# Patient Record
Sex: Female | Born: 2000 | Race: White | Hispanic: No | Marital: Single | Smoking: Former smoker
Health system: Southern US, Community
[De-identification: ages and names within clinical notes are randomized; demographics above are authoritative.]

## PROBLEM LIST (undated history)

## (undated) DIAGNOSIS — J309 Allergic rhinitis, unspecified: Secondary | ICD-10-CM

## (undated) DIAGNOSIS — F419 Anxiety disorder, unspecified: Secondary | ICD-10-CM

## (undated) DIAGNOSIS — J069 Acute upper respiratory infection, unspecified: Secondary | ICD-10-CM

## (undated) DIAGNOSIS — J45909 Unspecified asthma, uncomplicated: Secondary | ICD-10-CM

## (undated) DIAGNOSIS — F329 Major depressive disorder, single episode, unspecified: Secondary | ICD-10-CM

## (undated) DIAGNOSIS — G43909 Migraine, unspecified, not intractable, without status migrainosus: Secondary | ICD-10-CM

## (undated) HISTORY — DX: Unspecified asthma, uncomplicated: J45.909

## (undated) HISTORY — DX: Acute upper respiratory infection, unspecified: J06.9

## (undated) HISTORY — DX: Allergic rhinitis, unspecified: J30.9

## (undated) HISTORY — DX: Anxiety disorder, unspecified: F41.9

## (undated) HISTORY — DX: Major depressive disorder, single episode, unspecified: F32.9

## (undated) HISTORY — DX: Migraine, unspecified, not intractable, without status migrainosus: G43.909

---
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
## 2018-02-15 ENCOUNTER — Ambulatory Visit: Payer: BLUE CROSS/BLUE SHIELD | Admitting: Allergy

## 2018-02-15 ENCOUNTER — Encounter: Payer: Self-pay | Admitting: Allergy

## 2018-02-15 VITALS — BP 90/56 | HR 97 | Resp 16 | Ht 64.0 in | Wt 128.6 lb

## 2018-02-15 DIAGNOSIS — J310 Chronic rhinitis: Secondary | ICD-10-CM

## 2018-02-15 DIAGNOSIS — J4540 Moderate persistent asthma, uncomplicated: Secondary | ICD-10-CM | POA: Diagnosis not present

## 2018-02-15 MED ORDER — AZELASTINE-FLUTICASONE 137-50 MCG/ACT NA SUSP: 1.0000 | Freq: Two times a day (BID) | NASAL | 5 refills | Status: DC | PRN

## 2018-02-15 NOTE — Progress Notes (Signed)
New Patient Note  RE: Valerie [name]. [place] MRN: [id number] DOB: [birth year] Date of Office Visit: 02/15/2018  Referring provider: Corwin Levins, MD Primary care provider: Corwin Levins, MD  Chief Complaint: Breathing problems and sinus issues  History of present illness: Valerie [name] is a 17 y.o. female presenting today for consultation for asthma and allergies.  She has been having "breathing problems".  She states sometimes her airway feels "tight" or "too closed" and sometimes she notes "too much mucus" in her throat.  She states symptoms are worse at night and at work (works as Merchandiser, retail at [employer].)   She also reports cough and wheeze as well.  She has history of childhood asthma.  She states her asthma symptoms has been getting worse throughout the course of the year.    She has been on 3 rounds of antibiotics and steroids this year for "respiratory infections" and "asthma symptoms".  Last steroid course was about 1-2 months. No hospitalizations. Nighttime awakening 3 nights/week.   She does have a hydrocodone based cough syrup that she states she used sparingly and mostly will use at night if the cough is keeping her up and if the albuterol is not responding. She has been on symbicort now for past 1-2 months taking 1 puff twice a day.  She also was started on singulair about a 1 month ago.  She does feel that with the addition of Symbicort that her breathing issues are better but not resolved.  She also reports sinus issues.  She reports sinus infections 1-2 times a year treated with antibiotics.  She also reports having sinus HA, nasal congestion and drainage, eye pressure, sneezing.  Symptoms were primarily in spring and fall but this year states symptoms have been year-round.   She states she has tried Excedrin PM for the eye pressure and HA symptoms. She has used nasal spray but not sure what it was.    No history of eczema or food allergy.   Review of systems: Review  of Systems  Constitutional: Negative for chills, fever and malaise/fatigue.  HENT: Positive for congestion and sinus pain. Negative for ear discharge, ear pain, nosebleeds and sore throat.   Eyes: Negative for pain, discharge and redness.  Respiratory: Positive for cough, shortness of breath and wheezing.   Cardiovascular: Negative for chest pain.  Gastrointestinal: Negative for abdominal pain, constipation, diarrhea, nausea and vomiting.  Musculoskeletal: Negative for joint pain.  Skin: Negative for itching and rash.  Neurological: Positive for headaches.    All other systems negative unless noted above in HPI  Past medical history: Past Medical History:  Diagnosis Date  . Allergic rhinitis   . Anxiety   . Childhood asthma   . Depression 05/19/2017  . Migraine     Past surgical history: No past surgical history on file.  Family history:  Family History  Problem Relation Age of Onset  . Arthritis Mother   . Asthma Mother   . Depression Mother   . Hyperlipidemia Mother   . Depression Father   . Arthritis Maternal Grandmother   . Bone cancer Maternal Grandmother   . Heart disease Maternal Grandmother   . Stroke Maternal Grandmother   . Alcohol abuse Maternal Grandfather   . COPD Maternal Grandfather   . Cancer Maternal Grandfather   . AAA (abdominal aortic aneurysm) Maternal Grandfather   . Arthritis Paternal Grandmother   . Depression Paternal Grandmother   . Hypertension Paternal Grandmother   .  Arthritis Paternal Grandfather   . Hyperlipidemia Paternal Grandfather     Social history: She lives in a home with carpeting in the bedroom with electric heating and central cooling.  There are 6 dogs and 1 cat in the home.  There are chickens and [place] pigs outside the home.  There is no concern for roaches in the home but there is concern for water damage or mildew.  She is a Estate manager/land agent.  She denies a smoking history but states she did use a juul  e-cigarette for a couple of months.  Medication List: Allergies as of 02/15/2018   No Known Allergies     Medication List        Accurate as of 02/15/18  1:54 PM. Always use your most recent med list.          acetaminophen 500 MG tablet Commonly known as:  TYLENOL Take 500 mg by mouth every 6 (six) hours as needed.   albuterol 108 (90 Base) MCG/ACT inhaler Commonly known as:  PROVENTIL HFA;VENTOLIN HFA Inhale 2 puffs into the lungs every 6 (six) hours as needed for wheezing or shortness of breath.   aspirin-acetaminophen-caffeine 250-250-65 MG tablet Commonly known as:  EXCEDRIN MIGRAINE Take 1 tablet by mouth every 6 (six) hours as needed for headache.   budesonide-formoterol 160-4.5 MCG/ACT inhaler Commonly known as:  SYMBICORT Inhale 2 puffs into the lungs 2 (two) times daily.   citalopram 40 MG tablet Commonly known as:  CELEXA Take 1 tablet (40 mg total) by mouth daily.   ibuprofen 200 MG tablet Commonly known as:  ADVIL,MOTRIN Take 200 mg by mouth every 6 (six) hours as needed.   montelukast 10 MG tablet Commonly known as:  SINGULAIR Take 1 tablet (10 mg total) by mouth daily.       Known medication allergies: No Known Allergies   Physical examination: Blood pressure (!) 90/56, pulse 97, resp. rate 16, height 5\' 4"  (1.626 m), weight 128 lb 9.6 oz (58.3 kg), SpO2 97 %.  General: Alert, interactive, in no acute distress. HEENT: PERRLA, TMs pearly gray, turbinates moderately edematous without discharge, post-pharynx non erythematous. Neck: Supple without lymphadenopathy. Lungs: Clear to auscultation without wheezing, rhonchi or rales. {no increased work of breathing. CV: Normal S1, S2 without murmurs. Abdomen: Nondistended, nontender. Skin: Warm and dry, without lesions or rashes. Extremities:  No clubbing, cyanosis or edema. Neuro:   Grossly intact.  Diagnositics/Labs:  Spirometry: FEV1: 2.02L 60%, FVC: 3.37L 89% s/p BD she reversed to normal at  2.73L 81% FEV1 predicted  Allergy testing: environmental allergy skin prick testing is negative with positive histamine control.   Allergy testing results were read and interpreted by provider, documented by clinical staff.   Assessment and plan:   Asthma, moderate persistent   - continue Symbicort 2 puffs twice a day   - continue singulair 10mg  daily   - will obtain CBC to assess degree of eosinophilia which will help determine step-up therapy for asthma control if needed   Asthma control goals:   Full participation in all desired activities (may need albuterol before activity)  Albuterol use two time or less a week on average (not counting use with activity)  Cough interfering with sleep two time or less a month  Oral steroids no more than once a year  No hospitalizations  Rhinitis    - presumed allergic however environmental allergy testing today is negative   - will obtain environmental allergy panel by blood work   -  recommend taking daily antihistamine like Allegra 180mg , Xyzal 5mg  or Zyrtec 10mg  daily   - singulair as above   - for nasal congestion/drainage recommend using Dymista.  Trial dymista 1 spray each nostril twice a day.  This is a combination nasal spray with Flonase + Astelin (nasal antihistamine).  This helps with both nasal congestion and drainage.   Follow-up 3-4 months or sooner if needed  I appreciate the opportunity to take part in [place] care. Please do not hesitate to contact me with questions.  Sincerely,   Margo Aye, MD Allergy/Immunology [facility]

## 2018-02-15 NOTE — Patient Instructions (Addendum)
Asthma   - continue Symbicort 2 puffs twice a day   - continue singulair 10mg  daily   - will obtain CBC to assess degree of eosinophilia which will help determine step-up therapy for asthma control if needed   Asthma control goals:   Full participation in all desired activities (may need albuterol before activity)  Albuterol use two time or less a week on average (not counting use with activity)  Cough interfering with sleep two time or less a month  Oral steroids no more than once a year  No hospitalizations  Rhinitis    - presumed allergic however environmental allergy testing today is negative   - will obtain environmental allergy panel by blood work   - recommend taking daily antihistamine like Allegra 180mg , Xyzal 5mg  or Zyrtec 10mg  daily   - singulair as above   - for nasal congestion/drainage recommend using Dymista.  Trial dymista 1 spray each nostril twice a day.  This is a combination nasal spray with Flonase + Astelin (nasal antihistamine).  This helps with both nasal congestion and drainage.   Follow-up 3-4 months or sooner if needed

## 2018-02-18 LAB — ALLERGENS W/TOTAL IGE AREA 2
Aspergillus Fumigatus IgE: <0.1 kU/L
Bermuda Grass IgE: <0.1 kU/L
Common Silver Birch IgE: <0.1 kU/L
Elm, American IgE: <0.1 kU/L
IgE (Immunoglobulin E), Serum: 34 IU/mL (ref 6–495)
Johnson Grass IgE: <0.1 kU/L
Maple/Box Elder IgE: <0.1 kU/L
Oak, White IgE: <0.1 kU/L
Pigweed, Rough IgE: <0.1 kU/L
Timothy Grass IgE: <0.1 kU/L
White Mulberry IgE: <0.1 kU/L

## 2018-02-18 LAB — CBC WITH DIFFERENTIAL
Basophils Absolute: 0.1 10*3/uL (ref 0.0–0.2)
Basos: 1 %
EOS (ABSOLUTE): 0.5 10*3/uL — AB (ref 0.0–0.4)
EOS: 8 %
HEMATOCRIT: 40.4 % (ref 34.0–46.6)
Hemoglobin: 14 g/dL (ref 11.1–15.9)
IMMATURE GRANULOCYTES: 0 %
Immature Grans (Abs): 0 10*3/uL (ref 0.0–0.1)
LYMPHS ABS: 2 10*3/uL (ref 0.7–3.1)
Lymphs: 35 %
MCH: 31 pg (ref 26.6–33.0)
MCHC: 34.7 g/dL (ref 31.5–35.7)
MCV: 90 fL (ref 79–97)
MONOS ABS: 0.4 10*3/uL (ref 0.1–0.9)
Monocytes: 8 %
Neutrophils Absolute: 2.7 10*3/uL (ref 1.4–7.0)
Neutrophils: 48 %
RBC: 4.51 x10E6/uL (ref 3.77–5.28)
RDW: 12.5 % (ref 12.3–15.4)
WBC: 5.6 10*3/uL (ref 3.4–10.8)

## 2018-02-27 ENCOUNTER — Telehealth: Payer: Self-pay

## 2018-02-27 NOTE — Telephone Encounter (Signed)
Spoke to patient and she will be starting [place]

## 2018-02-27 NOTE — Telephone Encounter (Signed)
Patient called and stated that she received a my chart message about starting Harrington Challenger due to her lab results. Patient is interested in starting [place] and would like someone to reach out to discuss it more. Tammy could you please call patient and discuss this matter.

## 2018-02-27 NOTE — Telephone Encounter (Signed)
Noted - thank you very much!

## 2018-02-28 NOTE — Telephone Encounter (Signed)
Just FYI, I have started her PA process but her PBM is [employer] with Harrington Challenger in non formulary.  I am trying to get approval through her medical so will see.  May 24 2018 Harrington Challenger goes back on their formulary so I will try my best to work it out for her

## 2018-02-28 NOTE — Telephone Encounter (Signed)
Sounds good. Thanks for the update!   Braelynn Benning, MD [facility]  

## 2018-02-28 NOTE — Telephone Encounter (Signed)
Awesome! Thanks for taking such good care of the patients.  Malachi Bonds, MD [facility]

## 2018-03-06 NOTE — Telephone Encounter (Signed)
Please call patient with an update.

## 2018-03-07 ENCOUNTER — Telehealth: Payer: Self-pay | Admitting: *Deleted

## 2018-03-07 NOTE — Telephone Encounter (Signed)
Thanks Tammy for update.   Agree lets change to Nucala to get started on medication for better asthma control.

## 2018-03-07 NOTE — Telephone Encounter (Signed)
Called patient to discuss biologic for asthma again.  She has [employer] [place] and [address] prescription coverage.  Unfortunately we are unable to get coverage for Harrington Challenger thru the PBM since it is excluded and her medical has not approved same. I explained that Nucala could be as effective and option to get patient on anti IL-5 until Jan. 2020 when Harrington Challenger is added back to formulary and she is ok with that.  She is interested in possibly changing to autoinjector in future but I advised her that is discussion she could have in Jan and can reassess if she wants to stay on Nucala or change over to [place] at that time.

## 2018-03-07 NOTE — Telephone Encounter (Signed)
Called patient to discuss biologic for asthma again.  She has [employer] [place] and [address] prescription coverage.  Unfortunately we are unable to get coverage for Harrington Challenger thru the PBM since it is excluded and her medical has not approved same. I explained that Nucala could be as effective and option to get patient on anti IL-5 until Jan. 2020 when Harrington Challenger is added back to formulary and she is ok with that.  She is interested in possibly changing to autoinjector in future but I advised her that is discussion she could have in Jan and can reassess if she wants to stay on Nucala or change over to [place] at that time.

## 2018-03-08 ENCOUNTER — Ambulatory Visit (INDEPENDENT_AMBULATORY_CARE_PROVIDER_SITE_OTHER): Payer: BLUE CROSS/BLUE SHIELD | Admitting: *Deleted

## 2018-03-08 DIAGNOSIS — J4550 Severe persistent asthma, uncomplicated: Secondary | ICD-10-CM

## 2018-03-08 MED ORDER — MEPOLIZUMAB 100 MG ~~LOC~~ SOLR
100.0000 mg | SUBCUTANEOUS | Status: DC
  Administered 2018-03-08 – 2018-05-23 (×2): 100 mg via SUBCUTANEOUS

## 2018-03-08 NOTE — Progress Notes (Signed)
Immunotherapy   Patient Details  Name: Valerie [name]. [place] MRN: [id number] Date of Birth: [birth year]  03/08/2018  [name] started injections for  Nucala 100 mg every 28 days. Epi-Pen:Epi-Pen Available  Consent signed and patient instructions given. No problems after 30 minutes in the office.   Mariane Duval 03/08/2018, 2:30 PM

## 2018-03-22 ENCOUNTER — Ambulatory Visit: Payer: BLUE CROSS/BLUE SHIELD

## 2018-03-25 ENCOUNTER — Telehealth: Payer: BLUE CROSS/BLUE SHIELD | Admitting: Physician Assistant

## 2018-03-25 DIAGNOSIS — J8283 Eosinophilic asthma: Secondary | ICD-10-CM

## 2018-03-25 DIAGNOSIS — J4541 Moderate persistent asthma with (acute) exacerbation: Secondary | ICD-10-CM

## 2018-03-25 DIAGNOSIS — J82 Pulmonary eosinophilia, not elsewhere classified: Principal | ICD-10-CM

## 2018-03-25 NOTE — Progress Notes (Signed)
Based on what you shared with me it looks like you have a serious condition that should be evaluated in a face to face examination. 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 You may likely need a breathing treatment and systemic steroids. We need to rule out pneumonia given your history, as you are ar a higher risk for complications from respiratory infections.  NOTE: If you entered your credit card information for this eVisit, you will not be charged. You may see a "hold" on your card for the $30 but that hold will drop off and you will not have a charge processed.  If you are having a true medical emergency please call 911.  If you need an urgent face to face visit, [facility] has four urgent care centers for your convenience.  If you need care fast and have a high deductible or no insurance consider:   [email or web address] to reserve your spot online an avoid wait times  [facility] [address], [address] [place], [place] [address] 8 am to 8 pm Monday-Friday 10 am to 4 pm Saturday-Sunday *Across the street from [employer]  [address] [place], [address] 8 am to 5 pm Monday-Friday * In the [facility] on the [facility]   The following sites will take your  insurance:  . [facility]  [phone] Get Driving Directions Find a Provider at this Location  [address] [place], [place] [address] . 10 am to 8 pm Monday-Friday . 12 pm to 8 pm Saturday-Sunday   . [facility] at [facility]  [phone] Get Driving Directions Find a Provider at this Location  [address], [address] [place], [place] [address] . 8 am to 8 pm Monday-Friday . 9 am to 6 pm Saturday . 11 am to 6 pm Sunday   . [facility] at [facility]  [phone] Get Driving Directions  [address] [place], [place] [address] . 8 am to 8 pm Monday-Friday . 8 am to 4 pm Saturday-Sunday   Your e-visit answers were reviewed by a board certified advanced clinical practitioner to complete your personal care plan.  Thank you for using e-Visits.

## 2018-03-28 ENCOUNTER — Encounter: Payer: Self-pay | Admitting: Family

## 2018-03-28 ENCOUNTER — Ambulatory Visit: Payer: BLUE CROSS/BLUE SHIELD | Admitting: Family

## 2018-03-28 VITALS — BP 100/60 | HR 72 | Temp 98.1°F | Ht 64.0 in | Wt 131.1 lb

## 2018-03-28 DIAGNOSIS — J45909 Unspecified asthma, uncomplicated: Secondary | ICD-10-CM | POA: Diagnosis not present

## 2018-03-28 MED ORDER — DOXYCYCLINE HYCLATE 100 MG PO TABS: 100.0000 mg | ORAL_TABLET | Freq: Two times a day (BID) | ORAL | 0 refills | Status: DC

## 2018-03-28 MED ORDER — BENZONATATE 100 MG PO CAPS: 100.0000 mg | ORAL_CAPSULE | Freq: Three times a day (TID) | ORAL | 0 refills | Status: DC | PRN

## 2018-03-28 MED ORDER — HYDROCODONE-HOMATROPINE 5-1.5 MG/5ML PO SYRP: 5.0000 mL | ORAL_SOLUTION | Freq: Four times a day (QID) | ORAL | 0 refills | Status: AC | PRN

## 2018-03-28 NOTE — Progress Notes (Signed)
Valerie [name] is a 17 y.o. female with the following history as recorded in EpicCare:  Patient Active Problem List   Diagnosis Date Noted  . Asthma exacerbation 12/27/2017  . Ear pain, bilateral 12/01/2017  . Cough 11/16/2017  . Urinary tract infection without hematuria 05/22/2017  . Depression 05/19/2017  . Encounter for well adult exam with abnormal findings 05/19/2017  . Mild intermittent asthma   . Allergic rhinitis   . Migraine   . Anxiety     Current Outpatient Medications  Medication Sig Dispense Refill  . acetaminophen (TYLENOL) 500 MG tablet Take 500 mg by mouth every 6 (six) hours as needed.    Marland Kitchen albuterol (PROVENTIL HFA;VENTOLIN HFA) 108 (90 Base) MCG/ACT inhaler Inhale 2 puffs into the lungs every 6 (six) hours as needed for wheezing or shortness of breath. 18 g 11  . aspirin-acetaminophen-caffeine (EXCEDRIN MIGRAINE) 250-250-65 MG tablet Take 1 tablet by mouth every 6 (six) hours as needed for headache.    . Azelastine-Fluticasone 137-50 MCG/ACT SUSP Place 1 spray into the nose 2 (two) times daily as needed. 23 g 5  . budesonide-formoterol (SYMBICORT) 160-4.5 MCG/ACT inhaler Inhale 2 puffs into the lungs 2 (two) times daily. 1 Inhaler 11  . citalopram (CELEXA) 40 MG tablet Take 1 tablet (40 mg total) by mouth daily. 90 tablet 3  . ibuprofen (ADVIL,MOTRIN) 200 MG tablet Take 200 mg by mouth every 6 (six) hours as needed.    . montelukast (SINGULAIR) 10 MG tablet Take 1 tablet (10 mg total) by mouth daily. 30 tablet 11  . NUCALA 100 MG SOLR     . benzonatate (TESSALON) 100 MG capsule Take 1 capsule (100 mg total) by mouth 3 (three) times daily as needed. 20 capsule 0  . doxycycline (VIBRA-TABS) 100 MG tablet Take 1 tablet (100 mg total) by mouth 2 (two) times daily. 20 tablet 0  . HYDROcodone-homatropine (HYCODAN) 5-1.5 MG/5ML syrup Take 5 mLs by mouth every 6 (six) hours as needed for up to 10 days for cough. 100 mL 0   Current Facility-Administered Medications   Medication Dose Route Frequency Provider Last Rate Last Dose  . Mepolizumab SOLR 100 mg  100 mg Subcutaneous Q28 days Marcelyn Bruins, MD   100 mg at 03/08/18 1431    Allergies: Patient has no known allergies.  Past Medical History:  Diagnosis Date  . Allergic rhinitis   . Anxiety   . Childhood asthma   . Depression 05/19/2017  . Migraine     History reviewed. No pertinent surgical history.  Family History  Problem Relation Age of Onset  . Arthritis Mother   . Asthma Mother   . Depression Mother   . Hyperlipidemia Mother   . Depression Father   . Arthritis Maternal Grandmother   . Bone cancer Maternal Grandmother   . Heart disease Maternal Grandmother   . Stroke Maternal Grandmother   . Alcohol abuse Maternal Grandfather   . COPD Maternal Grandfather   . Cancer Maternal Grandfather   . AAA (abdominal aortic aneurysm) Maternal Grandfather   . Arthritis Paternal Grandmother   . Depression Paternal Grandmother   . Hypertension Paternal Grandmother   . Arthritis Paternal Grandfather   . Hyperlipidemia Paternal Grandfather     Social History   Tobacco Use  . Smoking status: Never Smoker  . Smokeless tobacco: Never Used  Substance Use Topics  . Alcohol use: No    Subjective:  Patient presents with 1 week history of cough/ congestion; eosinophilic  esophagitis/ asthma; has needed increase use of her inhaler recently;   LMP- 4 days ago;      Objective:  Vitals:   03/28/18 1011  BP: 100/60  Pulse: 72  Temp: 98.1 F (36.7 C)  TempSrc: Oral  SpO2: 99%  Weight: 131 lb 1.3 oz (59.5 kg)  Height: 5\' 4"  (1.626 m)    General: Well developed, well nourished, in no acute distress  Skin : Warm and dry.  Head: Normocephalic and atraumatic  Eyes: Sclera and conjunctiva clear; pupils round and reactive to light; extraocular movements intact  Ears: External normal; canals clear; tympanic membranes normal  Oropharynx: Pink, supple. No suspicious lesions  Neck:  Supple without thyromegaly, adenopathy  Lungs: Respirations unlabored; clear to auscultation bilaterally without wheeze, rales, rhonchi  CVS exam: normal rate and regular rhythm.  Neurologic: Alert and oriented; speech intact; face symmetrical; moves all extremities well; CNII-XII intact without focal deficit   Assessment:  1. Acute asthmatic bronchitis     Plan:  Rx for Doxycycline 100 mg bid x 10 days, Tessalon Perles 100 mg tid prn and Hycodan cough syrup to use at night as needed; increase fluids, rest; do not feel steroids warranted at this time. She is encouraged to contact her asthma specialist about symptoms- adjustment may need to be made to Nucala schedule.   No follow-ups on file.  No orders of the defined types were placed in this encounter.   Requested Prescriptions   Signed Prescriptions Disp Refills  . doxycycline (VIBRA-TABS) 100 MG tablet 20 tablet 0    Sig: Take 1 tablet (100 mg total) by mouth 2 (two) times daily.  . benzonatate (TESSALON) 100 MG capsule 20 capsule 0    Sig: Take 1 capsule (100 mg total) by mouth 3 (three) times daily as needed.  Marland Kitchen HYDROcodone-homatropine (HYCODAN) 5-1.5 MG/5ML syrup 100 mL 0    Sig: Take 5 mLs by mouth every 6 (six) hours as needed for up to 10 days for cough.

## 2018-04-05 ENCOUNTER — Ambulatory Visit: Payer: BLUE CROSS/BLUE SHIELD

## 2018-05-04 ENCOUNTER — Ambulatory Visit: Payer: BLUE CROSS/BLUE SHIELD

## 2018-05-23 ENCOUNTER — Ambulatory Visit (INDEPENDENT_AMBULATORY_CARE_PROVIDER_SITE_OTHER): Payer: BLUE CROSS/BLUE SHIELD | Admitting: *Deleted

## 2018-05-23 DIAGNOSIS — J4550 Severe persistent asthma, uncomplicated: Secondary | ICD-10-CM

## 2018-05-23 DIAGNOSIS — J4540 Moderate persistent asthma, uncomplicated: Secondary | ICD-10-CM

## 2018-05-31 ENCOUNTER — Encounter: Payer: Self-pay | Admitting: Allergy

## 2018-05-31 ENCOUNTER — Ambulatory Visit: Payer: BLUE CROSS/BLUE SHIELD | Admitting: Allergy

## 2018-05-31 VITALS — BP 98/74 | HR 103 | Resp 16 | Ht 64.0 in | Wt 132.2 lb

## 2018-05-31 DIAGNOSIS — J4540 Moderate persistent asthma, uncomplicated: Secondary | ICD-10-CM | POA: Diagnosis not present

## 2018-05-31 DIAGNOSIS — J310 Chronic rhinitis: Secondary | ICD-10-CM

## 2018-05-31 NOTE — Progress Notes (Signed)
Follow-up Note  RE: Valerie [name]. [place] MRN: [id number] DOB: 2000/10/07 Date of Office Visit: 05/31/2018   History of present illness: Valerie [name] is a 18 y.o. female presenting today for follow-up of asthma and rhinitis.  She was last seen in the office on February 15, 2018 by myself.  At this visit I recommended that she continue on Symbicort and Singulair and we did obtain a lab work which did show a significant degree of eosinophilia.  She was then started on monthly Nucala which she had an injection in October and a injection in December.  She did miss her November injection as she states it is rather difficult getting it here once a month for the injections.  She is very interested in doing the autoinjector at home monthly.  She states that since starting on the new Calla and be consistent with the Symbicort and Singulair she has had improvement in her symptoms and only reports shortness of breath 1-2 times a week while she is at work mostly as she works at a [employer] and has to do a lot of walking around.  She states she also will wear a mask if she is needing to work around the [employer] as the smells of the popcorn oils will sometimes trigger her symptoms.  She denies any flareups that have required ED or urgent care visit since her last visit and has not needed any oral steroids. With her rhinitis she denies any significant symptoms since her last visit and she is using Dymista which she states does work but she also does not like the taste of it.  She states that it does work better than the previous nasal sprays that she has tried.  She also will take either Allegra Xyzal or Zyrtec daily as needed.   Review of systems: Review of Systems  Constitutional: Negative for chills, fever and malaise/fatigue.  HENT: Negative for congestion, ear discharge, nosebleeds and sore throat.   Eyes: Negative for pain, discharge and redness.  Respiratory: Positive for shortness of  breath. Negative for cough and wheezing.   Cardiovascular: Negative for chest pain.  Gastrointestinal: Negative for abdominal pain, constipation, diarrhea, nausea and vomiting.  Musculoskeletal: Negative for joint pain.  Skin: Negative for itching and rash.  Neurological: Negative for headaches.    All other systems negative unless noted above in HPI  Past medical/social/surgical/family history have been reviewed and are unchanged unless specifically indicated below.  No changes  Medication List: Allergies as of 05/31/2018   No Known Allergies     Medication List       Accurate as of May 31, 2018  5:04 PM. Always use your most recent med list.        acetaminophen 500 MG tablet Commonly known as:  TYLENOL Take 500 mg by mouth every 6 (six) hours as needed.   albuterol 108 (90 Base) MCG/ACT inhaler Commonly known as:  PROVENTIL HFA;VENTOLIN HFA Inhale 2 puffs into the lungs every 6 (six) hours as needed for wheezing or shortness of breath.   aspirin-acetaminophen-caffeine 250-250-65 MG tablet Commonly known as:  EXCEDRIN MIGRAINE Take 1 tablet by mouth every 6 (six) hours as needed for headache.   Azelastine-Fluticasone 137-50 MCG/ACT Susp Place 1 spray into the nose 2 (two) times daily as needed.   benzonatate 100 MG capsule Commonly known as:  TESSALON Take 1 capsule (100 mg total) by mouth 3 (three) times daily as needed.   budesonide-formoterol 160-4.5 MCG/ACT inhaler Commonly  known as:  SYMBICORT Inhale 2 puffs into the lungs 2 (two) times daily.   citalopram 40 MG tablet Commonly known as:  CELEXA Take 1 tablet (40 mg total) by mouth daily.   doxycycline 100 MG tablet Commonly known as:  VIBRA-TABS Take 1 tablet (100 mg total) by mouth 2 (two) times daily.   ibuprofen 200 MG tablet Commonly known as:  ADVIL,MOTRIN Take 200 mg by mouth every 6 (six) hours as needed.   montelukast 10 MG tablet Commonly known as:  SINGULAIR Take 1 tablet (10 mg total)  by mouth daily.   NUCALA 100 MG Solr Generic drug:  Mepolizumab       Known medication allergies: No Known Allergies   Physical examination: Blood pressure 98/74, pulse (!) 103, resp. rate 16, height 5\' 4"  (1.626 m), weight 132 lb 3.2 oz (60 kg), SpO2 98 %.  General: Alert, interactive, in no acute distress. HEENT: PERRLA, TMs pearly gray, turbinates minimally edematous without discharge, post-pharynx non erythematous. Neck: Supple without lymphadenopathy. Lungs: Clear to auscultation without wheezing, rhonchi or rales. {no increased work of breathing. CV: Normal S1, S2 without murmurs. Abdomen: Nondistended, nontender. Skin: Warm and dry, without lesions or rashes. Extremities:  No clubbing, cyanosis or edema. Neuro:   Grossly intact.  Diagnositics/Labs: Labs:  Component     Latest Ref Rng & Units 02/15/2018  IgE (Immunoglobulin E), Serum     6 - 495 IU/mL 34  D Pteronyssinus IgE     Class 0 kU/L <0.10  D Farinae IgE     Class 0 kU/L <0.10  Cat Dander IgE     Class 0 kU/L <0.10  Dog Dander IgE     Class 0 kU/L <0.10  [place] Grass IgE     Class 0 kU/L <0.10  Timothy Grass IgE     Class 0 kU/L <0.10  Johnson Grass IgE     Class 0 kU/L <0.10  Cockroach, German IgE     Class 0 kU/L <0.10  Penicillium Chrysogen IgE     Class 0 kU/L <0.10  Cladosporium Herbarum IgE     Class 0 kU/L <0.10  Aspergillus Fumigatus IgE     Class 0 kU/L <0.10  Alternaria Alternata IgE     Class 0 kU/L <0.10  Maple/Box Elder IgE     Class 0 kU/L <0.10  Common Silver Charletta CousinBirch IgE     Class 0 kU/L <0.10  Cedar, [place] IgE     Class 0 kU/L <0.10  Oak, White IgE     Class 0 kU/L <0.10  Elm, American IgE     Class 0 kU/L <0.10  Cottonwood IgE     Class 0 kU/L <0.10  Pecan, Hickory IgE     Class 0 kU/L <0.10  White Mulberry IgE     Class 0 kU/L <0.10  Ragweed, Short IgE     Class 0 kU/L <0.10  Pigweed, Rough IgE     Class 0 kU/L <0.10  Sheep Sorrel IgE Qn     Class 0 kU/L  <0.10  Mouse Urine IgE     Class 0 kU/L <0.10  WBC     3.4 - 10.8 x10E3/uL 5.6  RBC     3.77 - 5.28 x10E6/uL 4.51  Hemoglobin     11.1 - 15.9 g/dL 16.114.0  HCT     09.634.0 - 04.546.6 % 40.4  MCV     79 - 97 fL 90  MCH     26.6 -  33.0 pg 31.0  MCHC     31.5 - 35.7 g/dL 96.7  RDW     89.3 - 81.0 % 12.5  Neutrophils     Not Estab. % 48  Lymphs     Not Estab. % 35  Monocytes     Not Estab. % 8  Eos     Not Estab. % 8  Basos     Not Estab. % 1  NEUT#     1.4 - 7.0 x10E3/uL 2.7  Lymphocyte #     0.7 - 3.1 x10E3/uL 2.0  Monocytes Absolute     0.1 - 0.9 x10E3/uL 0.4  EOS (ABSOLUTE)     0.0 - 0.4 x10E3/uL 0.5 (H)  Basophils Absolute     0.0 - 0.2 x10E3/uL 0.1  Immature Granulocytes     Not Estab. % 0  Immature Grans (Abs)     0.0 - 0.1 x10E3/uL 0.0    Spirometry: FEV1: 2.94L 88%, FVC: 3.6L 95%, ratio consistent with Nonobstructive pattern.  This is much improved from her previous study.  Assessment and plan:   Asthma, moderate persistent   - continue Symbicort 2 puffs twice a day   - continue singulair 10mg  daily   - continue monthly Nucala injections.  Will start approval process for the Nucala autoinjector so you can give to yourself monthly at home.  I do believe she would be compliant with the monthly auto injection and recommend that we make this change at this time.   Asthma control goals:   Full participation in all desired activities (may need albuterol before activity)  Albuterol use two time or less a week on average (not counting use with activity)  Cough interfering with sleep two time or less a month  Oral steroids no more than once a year  No hospitalizations  Rhinitis, nonallergic   - environmental allergy testing was negative by skin testing and serum IgE testing   - continue taking daily antihistamine like Allegra 180mg , Xyzal 5mg  or Zyrtec 10mg  daily   - singulair as above   - for nasal congestion/drainage continue using Dymista 1 spray each  nostril twice a day as needed.     Follow-up 4 months or sooner if needed  I appreciate the opportunity to take part in [place] care. Please do not hesitate to contact me with questions.  Sincerely,   Margo Aye, MD Allergy/Immunology [facility]

## 2018-05-31 NOTE — Patient Instructions (Addendum)
Asthma   - continue Symbicort 2 puffs twice a day   - continue singulair 10mg  daily   - continue monthly Nucala injections.  Will have Tammy start approval process for the Nucala autoinjector so you can give to yourself monthly at home.  Continue monthly injections in the office until you get the autoinjector.     Asthma control goals:   Full participation in all desired activities (may need albuterol before activity)  Albuterol use two time or less a week on average (not counting use with activity)  Cough interfering with sleep two time or less a month  Oral steroids no more than once a year  No hospitalizations  Rhinitis, nonallergic   - environmental allergy testing was negative by skin testing and serum IgE testing   - continue taking daily antihistamine like Allegra 180mg , Xyzal 5mg  or Zyrtec 10mg  daily   - singulair as above   - for nasal congestion/drainage continue using Dymista 1 spray each nostril twice a day as needed.     Follow-up 4 months or sooner if needed

## 2018-06-02 NOTE — Progress Notes (Signed)
Will start process and get in contact with her

## 2018-06-28 DIAGNOSIS — Z3202 Encounter for pregnancy test, result negative: Secondary | ICD-10-CM | POA: Diagnosis not present

## 2018-06-28 DIAGNOSIS — N912 Amenorrhea, unspecified: Secondary | ICD-10-CM | POA: Diagnosis not present

## 2018-06-28 DIAGNOSIS — Z113 Encounter for screening for infections with a predominantly sexual mode of transmission: Secondary | ICD-10-CM | POA: Diagnosis not present

## 2018-06-28 DIAGNOSIS — N941 Unspecified dyspareunia: Secondary | ICD-10-CM | POA: Diagnosis not present

## 2018-06-28 DIAGNOSIS — N760 Acute vaginitis: Secondary | ICD-10-CM | POA: Diagnosis not present

## 2018-08-22 ENCOUNTER — Encounter: Payer: Self-pay | Admitting: Internal Medicine

## 2018-09-01 ENCOUNTER — Other Ambulatory Visit: Payer: Self-pay | Admitting: Internal Medicine

## 2018-09-12 DIAGNOSIS — Z3040 Encounter for surveillance of contraceptives, unspecified: Secondary | ICD-10-CM | POA: Diagnosis not present

## 2018-09-29 ENCOUNTER — Other Ambulatory Visit: Payer: Self-pay

## 2018-09-29 ENCOUNTER — Ambulatory Visit (INDEPENDENT_AMBULATORY_CARE_PROVIDER_SITE_OTHER): Payer: BLUE CROSS/BLUE SHIELD | Admitting: Allergy

## 2018-09-29 ENCOUNTER — Encounter: Payer: Self-pay | Admitting: Allergy

## 2018-09-29 DIAGNOSIS — J4540 Moderate persistent asthma, uncomplicated: Secondary | ICD-10-CM

## 2018-09-29 DIAGNOSIS — J310 Chronic rhinitis: Secondary | ICD-10-CM

## 2018-09-29 NOTE — Progress Notes (Signed)
Start time:  1033 Finish Time:  1056 Where are you located:  home Do you give [place] permission to bill your insurance:  yes Are you signed up for my chart:  Yes             Pt states that because of a lot mucous, she has a very hard time breathing. Last dose of Nucala 09/13/18, injecting at home.

## 2018-09-29 NOTE — Patient Instructions (Addendum)
Asthma   - continue Symbicort 2 puffs twice a day   - continue singulair 10mg  daily   - continue monthly Nucala injections via autoinjector   Asthma control goals:   Full participation in all desired activities (may need albuterol before activity)  Albuterol use two time or less a week on average (not counting use with activity)  Cough interfering with sleep two time or less a month  Oral steroids no more than once a year  No hospitalizations  Rhinitis, nonallergic   - environmental allergy testing was negative by skin testing and serum IgE testing   - continue taking daily antihistamine like Allegra 180mg , Xyzal 5mg  or Zyrtec 10mg  daily as needed if finding a benefit   - singulair as above   - for nasal congestion/drainage continue using Dymista 1 spray each nostril twice a day as needed.     - if symptoms of mucus in the throat persist despite use of saline rinses as well as your Dymista then would recommend a trial of over-the-counter Pepcid 20 mg twice a day to rule out a reflux because of the drainage in her throat.  Follow-up 4 months or sooner if needed

## 2018-09-29 NOTE — Progress Notes (Signed)
RE: Valerie [name]. Valerie [name] MRN: [id number] DOB: July 06, 2000 Date of Telemedicine Visit: 09/29/2018  Referring provider: Corwin Levins, MD Primary care provider: Corwin Levins, MD  Chief Complaint: Asthma   Telemedicine Follow Up Visit via Telephone: I connected with Valerie [name] for a follow up on 09/29/18 by telephone and verified that I am speaking with the correct person using two identifiers.   I discussed the limitations, risks, security and privacy concerns of performing an evaluation and management service by telephone and the availability of in person appointments. I also discussed with the patient that there may be a patient responsible charge related to this service. The patient expressed understanding and agreed to proceed.  Patient is at home.  Provider is at the office.  Visit start time: 1036 Visit end time: 70 Insurance consent/check in by: Marlene Bast Medical consent and medical assistant/nurse: Darreld Mclean S  History of Present Illness: She is a 18 y.o. female, who is being followed for asthma and non-allergic rhinitis. Her previous allergy office visit was on 05/31/18 with Dr. Delorse Lek.   She states over the past month she has been having a lot of "mucus in my throat" and lot of throat clearing and states that sometimes it feels like she is getting choked up.  When this happens she states that she will use her albuterol inhaler which does seem to loosen up the mucus.  She states she is not using her Dymista regularly.  She also does perform saline rinses but states she only does this when she has a sinus infection but she has not tried this either.  She is using her Symbicort 160 mcg 2 puffs twice a day as well as her Singulair.  She is doing her Nucala autoinjector injections once a month and tolerating these well.  She states that her asthma has been doing well and she has not had any flareups or need to see an urgent care in ED or need to have systemic steroids. With her allergies  she states that she will take Zyrtec or Allegra. She states she may have had issues with reflux in the past but does not currently take anything for reflux at this time.   Assessment and Plan: Valerie [name] is a 18 y.o. female with: Patient Instructions  Asthma   - continue Symbicort 2 puffs twice a day   - continue singulair 10mg  daily   - continue monthly Nucala injections via autoinjector   Asthma control goals:   Full participation in all desired activities (may need albuterol before activity)  Albuterol use two time or less a week on average (not counting use with activity)  Cough interfering with sleep two time or less a month  Oral steroids no more than once a year  No hospitalizations  Rhinitis, nonallergic   - environmental allergy testing was negative by skin testing and serum IgE testing   - continue taking daily antihistamine like Allegra 180mg , Xyzal 5mg  or Zyrtec 10mg  daily as needed if finding a benefit   - singulair as above   - for nasal congestion/drainage continue using Dymista 1 spray each nostril twice a day as needed.     - if symptoms of mucus in the throat persist despite use of saline rinses as well as your Dymista then would recommend a trial of over-the-counter Pepcid 20 mg twice a day to rule out a reflux because of the drainage in her throat.  Follow-up 4 months or sooner if needed  Diagnostics: None.  Medication List:  Current Outpatient Medications  Medication Sig Dispense Refill  . acetaminophen (TYLENOL) 500 MG tablet Take 500 mg by mouth every 6 (six) hours as needed.    Marland Kitchen. albuterol (PROVENTIL HFA;VENTOLIN HFA) 108 (90 Base) MCG/ACT inhaler Inhale 2 puffs into the lungs every 6 (six) hours as needed for wheezing or shortness of breath. 18 g 11  . aspirin-acetaminophen-caffeine (EXCEDRIN MIGRAINE) 250-250-65 MG tablet Take 1 tablet by mouth every 6 (six) hours as needed for headache.    . Azelastine-Fluticasone 137-50 MCG/ACT SUSP Place 1  spray into the nose 2 (two) times daily as needed. 23 g 5  . budesonide-formoterol (SYMBICORT) 160-4.5 MCG/ACT inhaler Inhale 2 puffs into the lungs 2 (two) times daily. 1 Inhaler 11  . citalopram (CELEXA) 40 MG tablet TAKE 1 TABLET BY MOUTH EVERY DAY 90 tablet 1  . ibuprofen (ADVIL,MOTRIN) 200 MG tablet Take 200 mg by mouth every 6 (six) hours as needed.    . LO LOESTRIN FE 1 MG-10 MCG / 10 MCG tablet Take 1 tablet by mouth daily.    . montelukast (SINGULAIR) 10 MG tablet Take 1 tablet (10 mg total) by mouth daily. 30 tablet 11  . NUCALA 100 MG SOLR     . Sod Fluoride-Potassium Nitrate (PREVIDENT 5000 SENSITIVE) 1.1-5 % PSTE PreviDent 5000 Sensitive 1.1 %-5 % dental paste  USE AS DIRECTED 3 TIMES A DAY. DO NOT SWALLOW     Current Facility-Administered Medications  Medication Dose Route Frequency Provider Last Rate Last Dose  . Mepolizumab SOLR 100 mg  100 mg Subcutaneous Q28 days Marcelyn BruinsPadgett, Shaylar Patricia, MD   100 mg at 05/23/18 1746   Allergies: No Known Allergies I reviewed her past medical history, social history, family history, and environmental history and no significant changes have been reported from previous visit on 05/31/2018.  Review of Systems  Constitutional: Negative for chills and fever.  HENT: Positive for postnasal drip. Negative for congestion, ear discharge, sinus pressure, sinus pain, sneezing and sore throat.   Eyes: Negative for pain, discharge and itching.  Respiratory: Negative for cough, chest tightness, shortness of breath and wheezing.   Cardiovascular: Negative.   Gastrointestinal: Negative.   Skin: Negative for rash.  Neurological: Negative for headaches.   Objective: Physical Exam Not obtained as encounter was done via telephone.   Previous notes and tests were reviewed.  I discussed the assessment and treatment plan with the patient. The patient was provided an opportunity to ask questions and all were answered. The patient agreed with the plan and  demonstrated an understanding of the instructions.   The patient was advised to call back or seek an in-person evaluation if the symptoms worsen or if the condition fails to improve as anticipated.  I provided 20 minutes of non-face-to-face time during this encounter.  It was my [facility]. Please feel free to contact me with any questions or concerns.   Sincerely,  Shaylar Larose HiresPatricia Padgett, MD

## 2018-10-20 ENCOUNTER — Encounter: Payer: BLUE CROSS/BLUE SHIELD | Admitting: Internal Medicine

## 2018-11-13 ENCOUNTER — Other Ambulatory Visit: Payer: Self-pay

## 2018-11-13 ENCOUNTER — Ambulatory Visit (INDEPENDENT_AMBULATORY_CARE_PROVIDER_SITE_OTHER): Payer: BC Managed Care – PPO | Admitting: Internal Medicine

## 2018-11-13 ENCOUNTER — Encounter: Payer: Self-pay | Admitting: Internal Medicine

## 2018-11-13 DIAGNOSIS — J342 Deviated nasal septum: Secondary | ICD-10-CM

## 2018-11-13 DIAGNOSIS — F32A Depression, unspecified: Secondary | ICD-10-CM

## 2018-11-13 DIAGNOSIS — G43809 Other migraine, not intractable, without status migrainosus: Secondary | ICD-10-CM

## 2018-11-13 DIAGNOSIS — J4520 Mild intermittent asthma, uncomplicated: Secondary | ICD-10-CM | POA: Diagnosis not present

## 2018-11-13 DIAGNOSIS — K120 Recurrent oral aphthae: Secondary | ICD-10-CM | POA: Insufficient documentation

## 2018-11-13 DIAGNOSIS — J309 Allergic rhinitis, unspecified: Secondary | ICD-10-CM

## 2018-11-13 DIAGNOSIS — F329 Major depressive disorder, single episode, unspecified: Secondary | ICD-10-CM

## 2018-11-13 DIAGNOSIS — F419 Anxiety disorder, unspecified: Secondary | ICD-10-CM

## 2018-11-13 DIAGNOSIS — G4700 Insomnia, unspecified: Secondary | ICD-10-CM

## 2018-11-13 MED ORDER — RIZATRIPTAN BENZOATE 10 MG PO TABS: 10.0000 mg | ORAL_TABLET | ORAL | 5 refills | Status: DC | PRN

## 2018-11-13 MED ORDER — TRIAMCINOLONE ACETONIDE 0.1 % MT PSTE: 1.0000 "application " | PASTE | Freq: Two times a day (BID) | OROMUCOSAL | 12 refills | Status: DC

## 2018-11-13 MED ORDER — ZOLPIDEM TARTRATE ER 12.5 MG PO TBCR: 12.5000 mg | EXTENDED_RELEASE_TABLET | Freq: Every evening | ORAL | 5 refills | Status: DC | PRN

## 2018-11-13 NOTE — Assessment & Plan Note (Signed)
Not pregnant per pt, for maxalt 10 prn,  to f/u any worsening symptoms or concerns

## 2018-11-13 NOTE — Assessment & Plan Note (Signed)
Improved with celexa, to cont same tx

## 2018-11-13 NOTE — Assessment & Plan Note (Signed)
To restart dymista and zyrtec she has on hand at home per allergist

## 2018-11-13 NOTE — Assessment & Plan Note (Signed)
Ok for [employer] cr 12.5 qhs prn ,  to f/u any worsening symptoms or concerns

## 2018-11-13 NOTE — Patient Instructions (Signed)
Please take all new medication as prescribed - the maxalt as needed for migraine, kenalog in orabase for apthous ulcers, and Ambien CR for sleep  You will be contacted regarding the referral for: ENT  Please continue all other medications as before, and refills have been done if requested.  Please have the pharmacy call with any other refills you may need.  Please keep your appointments with your specialists as you may have planned

## 2018-11-13 NOTE — Assessment & Plan Note (Addendum)
stable overall by history and exam, recent data reviewed with pt, and pt to continue medical treatment as before,  to f/u any worsening symptoms or concerns  Note:  Total time for pt hx, exam, review of record with pt in the room, determination of diagnoses and plan for further eval and tx is > 40 min, with over 50% spent in coordination and counseling of patient including the differential dx, tx, further evaluation and other management of asthma, allergies, migraine, depression, anxiety, insomnia, and apthous ulcers of the mouth, and deviated nasal septum

## 2018-11-13 NOTE — Assessment & Plan Note (Signed)
stable overall by history and exam, recent data reviewed with pt, and pt to continue medical treatment as before,  to f/u any worsening symptoms or concerns  

## 2018-11-13 NOTE — Assessment & Plan Note (Signed)
Ok for ENT referral,  to f/u any worsening symptoms or concerns 

## 2018-11-13 NOTE — Progress Notes (Signed)
Patient ID: Valerie [name], female   DOB: 25-Feb-2001, 18 y.o.   MRN: [id number]   Virtual Visit via Video Note  I connected with Twana R. Herzberg on 11/13/18 at  4:20 PM EDT by a video enabled telemedicine application and verified that I am speaking with the correct person using two identifiers.  Location: Patient: at home with dogs barking Provider: at office   I discussed the limitations of evaluation and management by telemedicine and the availability of in person appointments. The patient expressed understanding and agreed to proceed.  History of Present Illness: Here to f/u with c/o ? Deviated nasal septum as she has ongoing years of difficulty with breathing through the right nostril.  Does have several wks ongoing nasal allergy symptoms with clearish congestion, itch and sneezing, without fever, pain, ST, cough, swelling or wheezing.  Asthma has been stable recently.  Also c/o intermittent typical migrain HA every week or so, some quite debilitating.  .Denies worsening depressive symptoms, suicidal ideation, or panic; has ongoing anxiety.  Has had worsening sleep such that she usually does not have trouble getting to sleep, but seems to wake up every 2 hours or so, just cant stay asleep all night.  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 Past Medical History:  Diagnosis Date  . Allergic rhinitis   . Anxiety   . Childhood asthma   . Depression 05/19/2017  . Migraine    History reviewed. No pertinent surgical history.  reports that she is a non-smoker but has been exposed to tobacco smoke. She has never used smokeless tobacco. She reports that she does not drink alcohol or use drugs. family history includes AAA (abdominal aortic aneurysm) in her maternal grandfather; Alcohol abuse in her maternal grandfather; Arthritis in her maternal grandmother, mother, paternal grandfather, and paternal grandmother; Asthma in her  mother; Bone cancer in her maternal grandmother; COPD in her maternal grandfather; Cancer in her maternal grandfather; Depression in her father, mother, and paternal grandmother; Heart disease in her maternal grandmother; Hyperlipidemia in her mother and paternal grandfather; Hypertension in her paternal grandmother; Stroke in her maternal grandmother. No Known Allergies Current Outpatient Medications on File Prior to Visit  Medication Sig Dispense Refill  . acetaminophen (TYLENOL) 500 MG tablet Take 500 mg by mouth every 6 (six) hours as needed.    Marland Kitchen. albuterol (PROVENTIL HFA;VENTOLIN HFA) 108 (90 Base) MCG/ACT inhaler Inhale 2 puffs into the lungs every 6 (six) hours as needed for wheezing or shortness of breath. 18 g 11  . aspirin-acetaminophen-caffeine (EXCEDRIN MIGRAINE) 250-250-65 MG tablet Take 1 tablet by mouth every 6 (six) hours as needed for headache.    . Azelastine-Fluticasone 137-50 MCG/ACT SUSP Place 1 spray into the nose 2 (two) times daily as needed. 23 g 5  . budesonide-formoterol (SYMBICORT) 160-4.5 MCG/ACT inhaler Inhale 2 puffs into the lungs 2 (two) times daily. 1 Inhaler 11  . citalopram (CELEXA) 40 MG tablet TAKE 1 TABLET BY MOUTH EVERY DAY 90 tablet 1  . ibuprofen (ADVIL,MOTRIN) 200 MG tablet Take 200 mg by mouth every 6 (six) hours as needed.    . LO LOESTRIN FE 1 MG-10 MCG / 10 MCG tablet Take 1 tablet by mouth daily.    . montelukast (SINGULAIR) 10 MG tablet Take 1 tablet (10 mg total) by mouth daily. 30 tablet 11  . NUCALA 100 MG SOLR     . Sod Fluoride-Potassium Nitrate (PREVIDENT 5000 SENSITIVE) 1.1-5 %  PSTE PreviDent 5000 Sensitive 1.1 %-5 % dental paste  USE AS DIRECTED 3 TIMES A DAY. DO NOT SWALLOW     Current Facility-Administered Medications on File Prior to Visit  Medication Dose Route Frequency Provider Last Rate Last Dose  . Mepolizumab SOLR 100 mg  100 mg Subcutaneous Q28 days Kennith Gain, MD   100 mg at 05/23/18 1746     Observations/Objective: Alert, NAD, appropriate mood and affect, resps normal, cn 2-12 intact, moves all 4s, no visible rash or swelling Lab Results  Component Value Date   WBC 5.6 02/15/2018   HGB 14.0 02/15/2018   HCT 40.4 02/15/2018   PLT 256.0 12/01/2017   GLUCOSE 79 12/01/2017   CHOL 134 05/19/2017   TRIG 99.0 05/19/2017   HDL 41.20 05/19/2017   LDLCALC 73 05/19/2017   ALT 11 12/01/2017   AST 15 12/01/2017   NA 138 12/01/2017   K 4.7 12/01/2017   CL 103 12/01/2017   CREATININE 0.81 12/01/2017   BUN 8 12/01/2017   CO2 26 12/01/2017   TSH 5.08 (H) 05/19/2017   Assessment and Plan: See notes  Follow Up Instructions: See notes   I discussed the assessment and treatment plan with the patient. The patient was provided an opportunity to ask questions and all were answered. The patient agreed with the plan and demonstrated an understanding of the instructions.   The patient was advised to call back or seek an in-person evaluation if the symptoms worsen or if the condition fails to improve as anticipated.   Cathlean Cower, MD

## 2018-11-13 NOTE — Assessment & Plan Note (Signed)
Ok for kenalog in orabase prn,  to f/u any worsening symptoms or concerns  

## 2018-11-15 ENCOUNTER — Telehealth: Payer: Self-pay

## 2018-11-15 NOTE — Telephone Encounter (Signed)
Pt is calling asking about stopping her montelukast because of depression.  Pt states that she feels differently since being on the medication and would like to stop.  Pt also states that she was told by her PCP to stop, wants to know should she. Per Dr Nelva Bush, it is okay for her to stop, but if she returns to her normal self then stay off of it.

## 2018-12-28 ENCOUNTER — Telehealth: Payer: Self-pay | Admitting: *Deleted

## 2018-12-28 NOTE — Telephone Encounter (Signed)
Called patient and inquired if she was still taking her Nucala autoinjector and she advised she was but this month insurance change to [employer].  I advised her as soon as she gets info to let me know and I will work on getting her approval

## 2019-01-01 ENCOUNTER — Encounter: Payer: Self-pay | Admitting: Internal Medicine

## 2019-02-01 ENCOUNTER — Ambulatory Visit: Payer: BLUE CROSS/BLUE SHIELD | Admitting: Allergy

## 2019-02-13 ENCOUNTER — Telehealth: Payer: Self-pay

## 2019-02-13 NOTE — Telephone Encounter (Signed)
[place] for OV but only if she feels it is getting larger in recent months

## 2019-02-13 NOTE — Telephone Encounter (Signed)
   Copied from [place] [phone]. Topic: General - Other >> Feb 13, 2019  1:37 PM Leward Quan A wrote: Reason for CRM: Patient called to say that she have a place on her left forearm she have a place that is under the skin about the size of a half marble. Not painful but uncomfortable when pressed on. Have some concerns about this area and would like a call back with some instructions please. Ph# [phone]

## 2019-02-13 NOTE — Telephone Encounter (Signed)
This is most likely a benign lesion that is very common related to skin, such as a dermatofibroma.  If no fever, redness, worsening pain, or drainage, it is unlikely to be infectious related.  This is most likely a benign problem that does not need further evaluation, except for referral to [facility] if you are certain the lesion is getting larger

## 2019-02-14 NOTE — Telephone Encounter (Signed)
I have tried to reach pt by phone and does not have VM.  I have sent pt a my chart message.

## 2019-02-22 ENCOUNTER — Other Ambulatory Visit: Payer: Self-pay | Admitting: Internal Medicine

## 2019-03-04 ENCOUNTER — Other Ambulatory Visit: Payer: Self-pay | Admitting: Internal Medicine

## 2019-03-05 ENCOUNTER — Other Ambulatory Visit: Payer: Self-pay | Admitting: Internal Medicine

## 2019-03-21 ENCOUNTER — Other Ambulatory Visit: Payer: Self-pay

## 2019-03-21 ENCOUNTER — Ambulatory Visit
Admission: EM | Admit: 2019-03-21 | Discharge: 2019-03-21 | Disposition: A | Discharge status: Home / Self Care | Payer: PRIVATE HEALTH INSURANCE | Attending: Emergency Medicine | Admitting: Emergency Medicine

## 2019-03-21 ENCOUNTER — Encounter: Payer: Self-pay | Admitting: Emergency Medicine

## 2019-03-21 DIAGNOSIS — G43009 Migraine without aura, not intractable, without status migrainosus: Secondary | ICD-10-CM | POA: Diagnosis not present

## 2019-03-21 MED ORDER — DEXAMETHASONE SODIUM PHOSPHATE 10 MG/ML IJ SOLN
10.0000 mg | Freq: Once | INTRAMUSCULAR | Status: AC
  Administered 2019-03-21: 10 mg via INTRAMUSCULAR

## 2019-03-21 MED ORDER — RIZATRIPTAN BENZOATE 10 MG PO TABS: 10.0000 mg | ORAL_TABLET | ORAL | 0 refills | Status: DC | PRN

## 2019-03-21 MED ORDER — ONDANSETRON 4 MG PO TBDP
4.0000 mg | ORAL_TABLET | Freq: Once | ORAL | Status: AC
  Administered 2019-03-21: 4 mg via ORAL

## 2019-03-21 MED ORDER — ONDANSETRON 4 MG PO TBDP: 4.0000 mg | ORAL_TABLET | Freq: Three times a day (TID) | ORAL | 0 refills | Status: DC | PRN

## 2019-03-21 MED ORDER — KETOROLAC TROMETHAMINE 60 MG/2ML IM SOLN
60.0000 mg | Freq: Once | INTRAMUSCULAR | Status: AC
  Administered 2019-03-21: 60 mg via INTRAMUSCULAR

## 2019-03-21 NOTE — ED Notes (Signed)
Patient able to ambulate independently  

## 2019-03-21 NOTE — Discharge Instructions (Addendum)
Go to ER for worsening head pain, change in vision, chest pain, shortness of breath, vomiting.

## 2019-03-21 NOTE — ED Provider Notes (Signed)
[facility]    CSN: [id number] Arrival date & time: 03/21/19  1014      History   Chief Complaint Chief Complaint  Patient presents with  . Migraine    HPI Valerie [name] is a 18 y.o. female with history of chronic sinusitis, migraine, eosinophilic asthma presenting for migraine since last night.  Patient states that this feels consistent with her previous migraines: Behind both eyes, extending into forehead with photophobia, nausea.  Patient did have one episode of emesis earlier this morning at work.  Patient states she went to bed she was feeling a little better after taking naproxen at home.  Patient has been out of her Maxalt which typically resolves her headaches, prescribed PCP.  Patient has been compliant with all her medications as outlined below.  Use her azelastine-fluticasone nasal spray earlier this morning with some sinus pressure relief.  Patient denies worst headache of life, thunderclap headache, head trauma, LOC.   Past Medical History:  Diagnosis Date  . Allergic rhinitis   . Anxiety   . Childhood asthma   . Depression 05/19/2017  . Migraine     Patient Active Problem List   Diagnosis Date Noted  . Insomnia 11/13/2018  . Aphthous ulcer of mouth 11/13/2018  . Deviated nasal septum 11/13/2018  . Asthma exacerbation 12/27/2017  . Ear pain, bilateral 12/01/2017  . Cough 11/16/2017  . Urinary tract infection without hematuria 05/22/2017  . Depression 05/19/2017  . Encounter for well adult exam with abnormal findings 05/19/2017  . Mild intermittent asthma   . Allergic rhinitis   . Migraine   . Anxiety     History reviewed. No pertinent surgical history.  OB History   No obstetric history on file.      Home Medications    Prior to Admission medications   Medication Sig Start Date End Date Taking? Authorizing Provider  acetaminophen (TYLENOL) 500 MG tablet Take 500 mg by mouth every 6 (six) hours as needed.    [provider]  albuterol (PROVENTIL HFA;VENTOLIN HFA) 108 (90 Base) MCG/ACT inhaler Inhale 2 puffs into the lungs every 6 (six) hours as needed for wheezing or shortness of breath. 12/27/17   Biagio Borg, MD  aspirin-acetaminophen-caffeine (EXCEDRIN MIGRAINE) [phone] MG tablet Take 1 tablet by mouth every 6 (six) hours as needed for headache.    [provider]  Azelastine-Fluticasone 137-50 MCG/ACT SUSP Place 1 spray into the nose 2 (two) times daily as needed. 02/15/18   Kennith Gain, MD  citalopram (CELEXA) 40 MG tablet TAKE 1 TABLET BY MOUTH EVERY DAY 03/05/19   Biagio Borg, MD  ibuprofen (ADVIL,MOTRIN) 200 MG tablet Take 200 mg by mouth every 6 (six) hours as needed.    [provider]  LO LOESTRIN FE 1 MG-10 MCG / 10 MCG tablet Take 1 tablet by mouth daily. 09/12/18   [provider]  montelukast (SINGULAIR) 10 MG tablet TAKE 1 TABLET BY MOUTH EVERY DAY 02/22/19   Biagio Borg, MD  NUCALA 100 MG SOLR  03/17/18   [provider]  ondansetron (ZOFRAN ODT) 4 MG disintegrating tablet Take 1 tablet (4 mg total) by mouth every 8 (eight) hours as needed for nausea or vomiting. 03/21/19   Hall-Potvin, [place], PA-C  rizatriptan (MAXALT) 10 MG tablet Take 1 tablet (10 mg total) by mouth as needed for migraine. May repeat in 2 hours if needed 03/21/19   Hall-Potvin, [place], PA-C  Sod Fluoride-Potassium Nitrate (PREVIDENT 5000  SENSITIVE) 1.1-5 % PSTE PreviDent 5000 Sensitive 1.1 %-5 % dental paste  USE AS DIRECTED 3 TIMES A DAY. DO NOT SWALLOW    [provider]  SYMBICORT 160-4.5 MCG/ACT inhaler TAKE 2 PUFFS BY MOUTH TWICE A DAY 03/05/19   Corwin Levins, MD  triamcinolone (KENALOG) 0.1 % paste Use as directed 1 application in the mouth or throat 2 (two) times daily. 11/13/18   Corwin Levins, MD  zolpidem (AMBIEN CR) 12.5 MG CR tablet Take 1 tablet (12.5 mg total) by mouth at bedtime as needed for up to 30 days for sleep. 11/13/18 12/13/18   Corwin Levins, MD    Family History Family History  Problem Relation Age of Onset  . Arthritis Mother   . Asthma Mother   . Depression Mother   . Hyperlipidemia Mother   . Depression Father   . Arthritis Maternal Grandmother   . Bone cancer Maternal Grandmother   . Heart disease Maternal Grandmother   . Stroke Maternal Grandmother   . Alcohol abuse Maternal Grandfather   . COPD Maternal Grandfather   . Cancer Maternal Grandfather   . AAA (abdominal aortic aneurysm) Maternal Grandfather   . Arthritis Paternal Grandmother   . Depression Paternal Grandmother   . Hypertension Paternal Grandmother   . Arthritis Paternal Grandfather   . Hyperlipidemia Paternal Grandfather     Social History Social History   Tobacco Use  . Smoking status: Passive Smoke Exposure - Never Smoker  . Smokeless tobacco: Never Used  . Tobacco comment: mom smokes both inside and outside  Substance Use Topics  . Alcohol use: No  . Drug use: No     Allergies   Patient has no known allergies.   Review of Systems Review of Systems  Constitutional: Negative for fatigue and fever.  HENT: Negative for ear pain, sinus pain, sore throat and voice change.   Eyes: Negative for pain, redness and visual disturbance.  Respiratory: Negative for cough and shortness of breath.   Cardiovascular: Negative for chest pain and palpitations.  Gastrointestinal: Positive for nausea. Negative for abdominal pain, diarrhea and vomiting.  Musculoskeletal: Negative for arthralgias and myalgias.  Skin: Negative for rash and wound.  Neurological: Positive for headaches. Negative for dizziness, tremors, syncope, facial asymmetry, speech difficulty, weakness, light-headedness and numbness.  Hematological: Does not bruise/bleed easily.     Physical Exam Triage Vital Signs ED Triage Vitals  Enc Vitals Group     BP      Pulse      Resp      Temp      Temp src      SpO2      Weight      Height      Head  Circumference      Peak Flow      Pain Score      Pain Loc      Pain Edu?      Excl. in GC?    No data found.  Updated Vital Signs BP 118/81 (BP Location: Left Arm)   Pulse 82   Temp 98.2 F (36.8 C) (Temporal)   Resp 16   LMP 03/14/2019   SpO2 98%   Visual Acuity Right Eye Distance:   Left Eye Distance:   Bilateral Distance:    Right Eye Near:   Left Eye Near:    Bilateral Near:     Physical Exam Constitutional:      General: She is not in acute  distress.    Appearance: She is normal weight.     Comments: Patient appears to be in pain, preferring lights off in room.  HENT:     Head: Normocephalic and atraumatic.     Mouth/Throat:     Mouth: Mucous membranes are moist.     Pharynx: Oropharynx is clear.  Eyes:     General: No scleral icterus.       Right eye: No discharge.        Left eye: No discharge.     Extraocular Movements: Extraocular movements intact.     Conjunctiva/sclera: Conjunctivae normal.     Pupils: Pupils are equal, round, and reactive to light.     Comments: Patient endorsing photophobia during exam  Cardiovascular:     Rate and Rhythm: Normal rate.  Pulmonary:     Effort: Pulmonary effort is normal.  Skin:    Capillary Refill: Capillary refill takes less than 2 seconds.     Coloration: Skin is not jaundiced or pale.  Neurological:     Mental Status: She is alert and oriented to person, place, and time.     Cranial Nerves: No cranial nerve deficit.     Sensory: No sensory deficit.     Motor: No weakness.     Coordination: Coordination normal.     Gait: Gait normal.     Deep Tendon Reflexes: Reflexes normal.      UC Treatments / Results  Labs (all labs ordered are listed, but only abnormal results are displayed) Labs Reviewed - No data to display  EKG   Radiology No results found.  Procedures Procedures (including critical care time)  Medications Ordered in UC Medications  ketorolac (TORADOL) injection 60 mg (60 mg  Intramuscular Given 03/21/19 1111)  dexamethasone (DECADRON) injection 10 mg (10 mg Intramuscular Given 03/21/19 1111)  ondansetron (ZOFRAN-ODT) disintegrating tablet 4 mg (4 mg Oral Given 03/21/19 1111)    Initial Impression / Assessment and Plan / UC Course  I have reviewed the triage vital signs and the nursing notes.  Pertinent labs & imaging results that were available during my care of the patient were reviewed by me and considered in my medical decision making (see chart for details).     Patient without neurocognitive deficits on exam.  Reporting current symptoms consistent with previous migraines.  Patient has never had sumatriptan before, no rizatriptan office: Patient given IM Toradol, Decadron, p.o. Zofran which she tolerated well.  Patient reporting improving symptoms at time of discharge.  Will refill Maxalt, provide Zofran if needed.  Patient to follow-up with PCP for further management if needed.  Return precautions discussed, patient verbalized understanding and is agreeable to plan. Final Clinical Impressions(s) / UC Diagnoses   Final diagnoses:  Migraine without aura and without status migrainosus, not intractable     Discharge Instructions     Go to ER for worsening head pain, change in vision, chest pain, shortness of breath, vomiting.    ED Prescriptions    Medication Sig Dispense Auth. Provider   rizatriptan (MAXALT) 10 MG tablet Take 1 tablet (10 mg total) by mouth as needed for migraine. May repeat in 2 hours if needed 10 tablet Hall-Potvin, [place], PA-C   ondansetron (ZOFRAN ODT) 4 MG disintegrating tablet Take 1 tablet (4 mg total) by mouth every 8 (eight) hours as needed for nausea or vomiting. 20 tablet Hall-Potvin, [place], PA-C     PDMP not reviewed this encounter.   Hall-Potvin, [place], [place]C 03/21/19 1554

## 2019-03-21 NOTE — ED Triage Notes (Signed)
Pt presents to [facility] for assessment of migraine last night that resolved.  States she went in to work this morning and had to get sent home because she threw up due to the pain from the Migraine.  precribed Maxalt in June 2020, states she has run out and has not been able to take anything for this migraine.

## 2019-04-27 ENCOUNTER — Ambulatory Visit (INDEPENDENT_AMBULATORY_CARE_PROVIDER_SITE_OTHER): Payer: BC Managed Care – PPO | Admitting: Internal Medicine

## 2019-04-27 DIAGNOSIS — J329 Chronic sinusitis, unspecified: Secondary | ICD-10-CM

## 2019-04-27 DIAGNOSIS — G43809 Other migraine, not intractable, without status migrainosus: Secondary | ICD-10-CM

## 2019-04-27 DIAGNOSIS — R5383 Other fatigue: Secondary | ICD-10-CM

## 2019-04-27 DIAGNOSIS — F32A Depression, unspecified: Secondary | ICD-10-CM

## 2019-04-27 DIAGNOSIS — F329 Major depressive disorder, single episode, unspecified: Secondary | ICD-10-CM

## 2019-04-27 MED ORDER — PREDNISONE 10 MG PO TABS: ORAL_TABLET | ORAL | 0 refills | Status: DC

## 2019-04-27 MED ORDER — LEVOFLOXACIN 500 MG PO TABS: 500.0000 mg | ORAL_TABLET | Freq: Every day | ORAL | 0 refills | Status: AC

## 2019-04-27 NOTE — Progress Notes (Signed)
Patient ID: Valerie [name]. [place], female   DOB: November 04, 2000, 18 y.o.   MRN: [id number]  Virtual Visit via Video Note  I connected with Valerie [name] on 04/28/19 at  2:00 PM EST by a video enabled telemedicine application and verified that I am speaking with the correct person using two identifiers.  Location: Patient: at home Provider: at office   I discussed the limitations of evaluation and management by telemedicine and the availability of in person appointments. The patient expressed understanding and agreed to proceed.  History of Present Illness: Here with 2 mo gradually worsening fatigue; normally only working 4-5 hrs per day, but then at home she is asleep just about all the rest of the time at home, just cant seem to stay awake; no snoring but not sure and has no roomates.  Does have chronic sinusitis and has been particularly congested in the last few months without high fever or chills, just marked congestion and pressure.  Also has a level of anxiety and recurring migraine but this is different and not better with the maxalt like the migraines.  Denies worsening depressive symptoms, suicidal ideation, or panic.  Denies hyper or hypo thyroid symptoms such as voice, skin or hair change.  No known exposure to mono, recent drug change, pregnancy (on BCP) or illicit drugs.   Past Medical History:  Diagnosis Date  . Allergic rhinitis   . Anxiety   . Childhood asthma   . Depression 05/19/2017  . Migraine    No past surgical history on file.  reports that she is a non-smoker but has been exposed to tobacco smoke. She has never used smokeless tobacco. She reports that she does not drink alcohol or use drugs. family history includes AAA (abdominal aortic aneurysm) in her maternal grandfather; Alcohol abuse in her maternal grandfather; Arthritis in her maternal grandmother, mother, paternal grandfather, and paternal grandmother; Asthma in her mother; Bone cancer in her maternal grandmother;  COPD in her maternal grandfather; Cancer in her maternal grandfather; Depression in her father, mother, and paternal grandmother; Heart disease in her maternal grandmother; Hyperlipidemia in her mother and paternal grandfather; Hypertension in her paternal grandmother; Stroke in her maternal grandmother. No Known Allergies Current Outpatient Medications on File Prior to Visit  Medication Sig Dispense Refill  . acetaminophen (TYLENOL) 500 MG tablet Take 500 mg by mouth every 6 (six) hours as needed.    Marland Kitchen albuterol (PROVENTIL HFA;VENTOLIN HFA) 108 (90 Base) MCG/ACT inhaler Inhale 2 puffs into the lungs every 6 (six) hours as needed for wheezing or shortness of breath. 18 g 11  . aspirin-acetaminophen-caffeine (EXCEDRIN MIGRAINE) 124-580-99 MG tablet Take 1 tablet by mouth every 6 (six) hours as needed for headache.    . Azelastine-Fluticasone 137-50 MCG/ACT SUSP Place 1 spray into the nose 2 (two) times daily as needed. 23 g 5  . citalopram (CELEXA) 40 MG tablet TAKE 1 TABLET BY MOUTH EVERY DAY 90 tablet 1  . ibuprofen (ADVIL,MOTRIN) 200 MG tablet Take 200 mg by mouth every 6 (six) hours as needed.    . LO LOESTRIN FE 1 MG-10 MCG / 10 MCG tablet Take 1 tablet by mouth daily.    . montelukast (SINGULAIR) 10 MG tablet TAKE 1 TABLET BY MOUTH EVERY DAY 90 tablet 0  . NUCALA 100 MG SOLR     . ondansetron (ZOFRAN ODT) 4 MG disintegrating tablet Take 1 tablet (4 mg total) by mouth every 8 (eight) hours as needed for nausea or vomiting. 20 tablet  0  . rizatriptan (MAXALT) 10 MG tablet Take 1 tablet (10 mg total) by mouth as needed for migraine. May repeat in 2 hours if needed 10 tablet 0  . Sod Fluoride-Potassium Nitrate (PREVIDENT 5000 SENSITIVE) 1.1-5 % PSTE PreviDent 5000 Sensitive 1.1 %-5 % dental paste  USE AS DIRECTED 3 TIMES A DAY. DO NOT SWALLOW    . SYMBICORT 160-4.5 MCG/ACT inhaler TAKE 2 PUFFS BY MOUTH TWICE A DAY 30.6 Inhaler 3  . triamcinolone (KENALOG) 0.1 % paste Use as directed 1 application  in the mouth or throat 2 (two) times daily. 5 g 12  . zolpidem (AMBIEN CR) 12.5 MG CR tablet Take 1 tablet (12.5 mg total) by mouth at bedtime as needed for up to 30 days for sleep. 30 tablet 5   Current Facility-Administered Medications on File Prior to Visit  Medication Dose Route Frequency Provider Last Rate Last Dose  . Mepolizumab SOLR 100 mg  100 mg Subcutaneous Q28 days Marcelyn Bruins, MD   100 mg at 05/23/18 1746    Observations/Objective: Alert, NAD, appropriate mood and affect, resps normal, cn 2-12 intact, moves all 4s, no visible rash or swelling Lab Results  Component Value Date   WBC 5.6 02/15/2018   HGB 14.0 02/15/2018   HCT 40.4 02/15/2018   PLT 256.0 12/01/2017   GLUCOSE 79 12/01/2017   CHOL 134 05/19/2017   TRIG 99.0 05/19/2017   HDL 41.20 05/19/2017   LDLCALC 73 05/19/2017   ALT 11 12/01/2017   AST 15 12/01/2017   NA 138 12/01/2017   K 4.7 12/01/2017   CL 103 12/01/2017   CREATININE 0.81 12/01/2017   BUN 8 12/01/2017   CO2 26 12/01/2017   TSH 5.08 (H) 05/19/2017   Assessment and Plan: See notes  Follow Up Instructions: See notes   I discussed the assessment and treatment plan with the patient. The patient was provided an opportunity to ask questions and all were answered. The patient agreed with the plan and demonstrated an understanding of the instructions.   The patient was advised to call back or seek an in-person evaluation if the symptoms worsen or if the condition fails to improve as anticipated   Oliver Barre, MD

## 2019-04-27 NOTE — Patient Instructions (Signed)
Please take all new medication as prescribed - the antibiotic and prednisone  Please have a friend check your oximetry at night with you sleeping, and report any snoring  Please continue all other medications as before, and refills have been done if requested.  Please have the pharmacy call with any other refills you may need.  Please keep your appointments with your specialists as you may have planned  You will be contacted regarding the referral for: ENT - Dr Lucia Gaskins after May 25, 2019  Please go to the LAB in the Basement (turn left off the elevator) for the tests to be done today  You will be contacted by phone if any changes need to be made immediately.  Otherwise, you will receive a letter about your results with an explanation, but please check with MyChart first.  Please remember to sign up for MyChart if you have not done so, as this will be important to you in the future with finding out test results, communicating by private email, and scheduling acute appointments online when needed.

## 2019-04-28 ENCOUNTER — Encounter: Payer: Self-pay | Admitting: Internal Medicine

## 2019-04-28 DIAGNOSIS — J329 Chronic sinusitis, unspecified: Secondary | ICD-10-CM | POA: Insufficient documentation

## 2019-04-28 DIAGNOSIS — R5383 Other fatigue: Secondary | ICD-10-CM | POA: Insufficient documentation

## 2019-04-28 NOTE — Assessment & Plan Note (Addendum)
Etiology unclear, ? A type of sleep apnea due to upper airway resistance syndrome vs other - for trial antibx, predpac asd, friend to check oximetry as she sleeps, check labs including mono, TFTs, cbc, cmet, and ENT referral after Jan 1 per pt due to insurance  Note:  Total time for pt hx, exam, review of record with pt in the room, determination of diagnoses and plan for further eval and tx is > 40 min, with over 50% spent in coordination and counseling of patient including the differential dx, tx, further evaluation and other management of fatigue, chronic sinusitis, migraine, depression

## 2019-04-28 NOTE — Assessment & Plan Note (Signed)
stable overall by history and exam, recent data reviewed with pt, and pt to continue medical treatment as before,  to f/u any worsening symptoms or concerns  

## 2019-04-28 NOTE — Assessment & Plan Note (Signed)
For ENT referral as above 

## 2019-05-29 ENCOUNTER — Other Ambulatory Visit: Payer: Self-pay

## 2019-05-29 ENCOUNTER — Ambulatory Visit (INDEPENDENT_AMBULATORY_CARE_PROVIDER_SITE_OTHER): Payer: BC Managed Care – PPO | Admitting: Otolaryngology

## 2019-05-29 ENCOUNTER — Encounter (INDEPENDENT_AMBULATORY_CARE_PROVIDER_SITE_OTHER): Payer: Self-pay | Admitting: Otolaryngology

## 2019-05-29 VITALS — Temp 97.9°F

## 2019-05-29 DIAGNOSIS — J310 Chronic rhinitis: Secondary | ICD-10-CM

## 2019-05-29 NOTE — Progress Notes (Signed)
HPI: Valerie [name] is a 19 y.o. female who presents for evaluation of chronic nasal sinus symptoms and sinus pressure and headaches. Patient has a history of eosinophilic asthma. She has had a longstanding history of sinus symptoms with trouble breathing, postnasal drainage and facial pressure. She has previously been prescribed Dymista which she uses occasionally but not regularly. Several months ago she had some episodes where she coughed up some bloody mucus but this has not occurred recently. Denies any yellow-green discharge from her nose. She generally has more trouble breathing through the right side than the left side.  Past Medical History:  Diagnosis Date  . Allergic rhinitis   . Anxiety   . Childhood asthma   . Depression 05/19/2017  . Migraine    No past surgical history on file. Social History   Socioeconomic History  . Marital status: Single    Spouse name: Not on file  . Number of children: Not on file  . Years of education: Not on file  . Highest education level: Not on file  Occupational History  . Not on file  Tobacco Use  . Smoking status: Passive Smoke Exposure - Never Smoker  . Smokeless tobacco: Never Used  . Tobacco comment: mom smokes both inside and outside  Substance and Sexual Activity  . Alcohol use: No  . Drug use: No  . Sexual activity: Never  Other Topics Concern  . Not on file  Social History Narrative  . Not on file   Social Determinants of Health   Financial Resource Strain:   . Difficulty of Paying Living Expenses: Not on file  Food Insecurity:   . Worried About Programme researcher, broadcasting/film/video in the Last Year: Not on file  . Ran Out of Food in the Last Year: Not on file  Transportation Needs:   . Lack of Transportation (Medical): Not on file  . Lack of Transportation (Non-Medical): Not on file  Physical Activity:   . Days of Exercise per Week: Not on file  . Minutes of Exercise per Session: Not on file  Stress:   . Feeling of Stress : Not  on file  Social Connections:   . Frequency of Communication with Friends and Family: Not on file  . Frequency of Social Gatherings with Friends and Family: Not on file  . Attends Religious Services: Not on file  . Active Member of Clubs or Organizations: Not on file  . Attends Banker Meetings: Not on file  . Marital Status: Not on file   Family History  Problem Relation Age of Onset  . Arthritis Mother   . Asthma Mother   . Depression Mother   . Hyperlipidemia Mother   . Depression Father   . Arthritis Maternal Grandmother   . Bone cancer Maternal Grandmother   . Heart disease Maternal Grandmother   . Stroke Maternal Grandmother   . Alcohol abuse Maternal Grandfather   . COPD Maternal Grandfather   . Cancer Maternal Grandfather   . AAA (abdominal aortic aneurysm) Maternal Grandfather   . Arthritis Paternal Grandmother   . Depression Paternal Grandmother   . Hypertension Paternal Grandmother   . Arthritis Paternal Grandfather   . Hyperlipidemia Paternal Grandfather    No Known Allergies Prior to Admission medications   Medication Sig Start Date End Date Taking? Authorizing Provider  acetaminophen (TYLENOL) 500 MG tablet Take 500 mg by mouth every 6 (six) hours as needed.   Yes [provider]  albuterol (PROVENTIL HFA;VENTOLIN  HFA) 108 (90 Base) MCG/ACT inhaler Inhale 2 puffs into the lungs every 6 (six) hours as needed for wheezing or shortness of breath. 12/27/17  Yes Corwin Levins, MD  aspirin-acetaminophen-caffeine (EXCEDRIN MIGRAINE) [phone] MG tablet Take 1 tablet by mouth every 6 (six) hours as needed for headache.   Yes [provider]  Azelastine-Fluticasone 137-50 MCG/ACT SUSP Place 1 spray into the nose 2 (two) times daily as needed. 02/15/18  Yes Marcelyn Bruins, MD  citalopram (CELEXA) 40 MG tablet TAKE 1 TABLET BY MOUTH EVERY DAY 03/05/19  Yes Corwin Levins, MD  ibuprofen (ADVIL,MOTRIN) 200 MG tablet Take 200 mg by mouth  every 6 (six) hours as needed.   Yes [provider]  LO LOESTRIN FE 1 MG-10 MCG / 10 MCG tablet Take 1 tablet by mouth daily. 09/12/18  Yes [provider]  montelukast (SINGULAIR) 10 MG tablet TAKE 1 TABLET BY MOUTH EVERY DAY 02/22/19  Yes Corwin Levins, MD  NUCALA 100 MG SOLR  03/17/18  Yes [provider]  ondansetron (ZOFRAN ODT) 4 MG disintegrating tablet Take 1 tablet (4 mg total) by mouth every 8 (eight) hours as needed for nausea or vomiting. 03/21/19  Yes Hall-Potvin, [place], PA-C  predniSONE (DELTASONE) 10 MG tablet 3 tabs by mouth per day for 3 days,2tabs per day for 3 days,1tab per day for 3 days 04/27/19  Yes Corwin Levins, MD  rizatriptan (MAXALT) 10 MG tablet Take 1 tablet (10 mg total) by mouth as needed for migraine. May repeat in 2 hours if needed 03/21/19  Yes Hall-Potvin, [place], PA-C  Sod Fluoride-Potassium Nitrate (PREVIDENT 5000 SENSITIVE) 1.1-5 % PSTE PreviDent 5000 Sensitive 1.1 %-5 % dental paste  USE AS DIRECTED 3 TIMES A DAY. DO NOT SWALLOW   Yes [provider]  SYMBICORT 160-4.5 MCG/ACT inhaler TAKE 2 PUFFS BY MOUTH TWICE A DAY 03/05/19  Yes Corwin Levins, MD  triamcinolone (KENALOG) 0.1 % paste Use as directed 1 application in the mouth or throat 2 (two) times daily. 11/13/18  Yes Corwin Levins, MD  zolpidem (AMBIEN CR) 12.5 MG CR tablet Take 1 tablet (12.5 mg total) by mouth at bedtime as needed for up to 30 days for sleep. 11/13/18 12/13/18  Corwin Levins, MD     Positive ROS: Otherwise negative  All other systems have been reviewed and were otherwise negative with the exception of those mentioned in the HPI and as above.  Physical Exam: Constitutional: Alert, well-appearing, no acute distress Ears: External ears without lesions or tenderness. Ear canals are clear bilaterally with intact, clear TMs. TMs are clear bilaterally with good mobility pneumatic otoscopy. Nasal: External nose without lesions. Septum slightly deviated to  the right. She has large swollen turbinates bilaterally with a moderate mucosal swelling in the intranasal cavity. After decongesting the nose. The middle meatus regions were clear bilaterally. No obvious polyps noted. No gross mucopurulent discharge noted.. Clear nasal passages Oral: Lips and gums without lesions. Tongue and palate mucosa without lesions. Posterior oropharynx clear. Neck: No palpable adenopathy or masses Respiratory: Breathing comfortably  Skin: No facial/neck lesions or rash noted.  Procedures  Assessment: Chronic rhinitis  Plan: I would recommend regular use of nasal steroid spray either Nasacort 2 sprays each nostril at night or Dymista 1 spray each nostril twice daily as this would provide some reduction of the swelling of the mucous membranes of the nose and sinuses. If she continues to have chronic symptoms despite regular use of  the medical treatment with regular use of nasal steroid spray and antihistamines as needed could consider further evaluation of the sinuses with a CT scan and possible surgical intervention if warranted but initially would try the sprays regularly over the next 6 weeks. She will call us back if she continues have a lot of problems to schedule CT scan if needed.  Radene Journey, MD

## 2019-08-22 ENCOUNTER — Encounter: Payer: Self-pay | Admitting: Internal Medicine

## 2019-08-22 ENCOUNTER — Ambulatory Visit (INDEPENDENT_AMBULATORY_CARE_PROVIDER_SITE_OTHER): Payer: BC Managed Care – PPO | Admitting: Internal Medicine

## 2019-08-22 DIAGNOSIS — F419 Anxiety disorder, unspecified: Secondary | ICD-10-CM

## 2019-08-22 DIAGNOSIS — J4531 Mild persistent asthma with (acute) exacerbation: Secondary | ICD-10-CM | POA: Diagnosis not present

## 2019-08-22 DIAGNOSIS — F32A Depression, unspecified: Secondary | ICD-10-CM

## 2019-08-22 DIAGNOSIS — F329 Major depressive disorder, single episode, unspecified: Secondary | ICD-10-CM

## 2019-08-22 MED ORDER — BUDESONIDE-FORMOTEROL FUMARATE 160-4.5 MCG/ACT IN AERO: INHALATION_SPRAY | RESPIRATORY_TRACT | 3 refills | Status: DC

## 2019-08-22 MED ORDER — PREDNISONE 10 MG PO TABS: ORAL_TABLET | ORAL | 0 refills | Status: DC

## 2019-08-22 NOTE — Assessment & Plan Note (Addendum)
Mild, for predpac asd, increase symbicort to 2 puff bid, cont all other tx, f/u pulm if not improved  I spent 30 minutes in preparing to see the patient by review of recent labs, imaging and procedures, obtaining and reviewing separately obtained history, communicating with the patient and family or caregiver, ordering medications, tests or procedures, and documenting clinical information in the EHR including the differential Dx, treatment, and any further evaluation and other management of asthma exacerbation, depresison, anxiety

## 2019-08-22 NOTE — Patient Instructions (Signed)
See notes

## 2019-08-22 NOTE — Assessment & Plan Note (Signed)
stable overall by history and exam, recent data reviewed with pt, and pt to continue medical treatment as before,  to f/u any worsening symptoms or concerns  

## 2019-08-22 NOTE — Progress Notes (Signed)
Patient ID: Valerie [name], female   DOB: 2000-06-12, 19 y.o.   MRN: [id number]  Virtual Visit via Video Note  I connected with Valerie [name] on 08/22/19 at  3:40 PM EDT by a video enabled telemedicine application and verified that I am speaking with the correct person using two identifiers.  Location: Patient: at home Provider: at office   I discussed the limitations of evaluation and management by telemedicine and the availability of in person appointments. The patient expressed understanding and agreed to proceed.  History of Present Illness: Pt denies chest pain, orthopnea, PND, increased LE swelling, palpitations, dizziness or syncope, but c/o increaesed sob, doe and night time wheezing rattling for last 2 wks; good compliance with meds except only taking the symbicort 2 puffs AM only, and not able to afford the mepolizumab at $1000/mo.  Symptoms wake her up at night.  Denies worsening depressive symptoms, suicidal ideation, or panic Past Medical History:  Diagnosis Date  . Allergic rhinitis   . Anxiety   . Childhood asthma   . Depression 05/19/2017  . Migraine    No past surgical history on file.  reports that she is a non-smoker but has been exposed to tobacco smoke. She has never used smokeless tobacco. She reports that she does not drink alcohol or use drugs. family history includes AAA (abdominal aortic aneurysm) in her maternal grandfather; Alcohol abuse in her maternal grandfather; Arthritis in her maternal grandmother, mother, paternal grandfather, and paternal grandmother; Asthma in her mother; Bone cancer in her maternal grandmother; COPD in her maternal grandfather; Cancer in her maternal grandfather; Depression in her father, mother, and paternal grandmother; Heart disease in her maternal grandmother; Hyperlipidemia in her mother and paternal grandfather; Hypertension in her paternal grandmother; Stroke in her maternal grandmother. No Known Allergies Current  Outpatient Medications on File Prior to Visit  Medication Sig Dispense Refill  . acetaminophen (TYLENOL) 500 MG tablet Take 500 mg by mouth every 6 (six) hours as needed.    Marland Kitchen albuterol (PROVENTIL HFA;VENTOLIN HFA) 108 (90 Base) MCG/ACT inhaler Inhale 2 puffs into the lungs every 6 (six) hours as needed for wheezing or shortness of breath. 18 g 11  . aspirin-acetaminophen-caffeine (EXCEDRIN MIGRAINE) 250-250-65 MG tablet Take 1 tablet by mouth every 6 (six) hours as needed for headache.    . Azelastine-Fluticasone 137-50 MCG/ACT SUSP Place 1 spray into the nose 2 (two) times daily as needed. 23 g 5  . citalopram (CELEXA) 40 MG tablet TAKE 1 TABLET BY MOUTH EVERY DAY 90 tablet 1  . ibuprofen (ADVIL,MOTRIN) 200 MG tablet Take 200 mg by mouth every 6 (six) hours as needed.    . LO LOESTRIN FE 1 MG-10 MCG / 10 MCG tablet Take 1 tablet by mouth daily.    . montelukast (SINGULAIR) 10 MG tablet TAKE 1 TABLET BY MOUTH EVERY DAY 90 tablet 0  . NUCALA 100 MG SOLR     . ondansetron (ZOFRAN ODT) 4 MG disintegrating tablet Take 1 tablet (4 mg total) by mouth every 8 (eight) hours as needed for nausea or vomiting. 20 tablet 0  . rizatriptan (MAXALT) 10 MG tablet Take 1 tablet (10 mg total) by mouth as needed for migraine. May repeat in 2 hours if needed 10 tablet 0  . Sod Fluoride-Potassium Nitrate (PREVIDENT 5000 SENSITIVE) 1.1-5 % PSTE PreviDent 5000 Sensitive 1.1 %-5 % dental paste  USE AS DIRECTED 3 TIMES A DAY. DO NOT SWALLOW    . triamcinolone (KENALOG) 0.1 % paste  Use as directed 1 application in the mouth or throat 2 (two) times daily. 5 g 12  . zolpidem (AMBIEN CR) 12.5 MG CR tablet Take 1 tablet (12.5 mg total) by mouth at bedtime as needed for up to 30 days for sleep. 30 tablet 5   Current Facility-Administered Medications on File Prior to Visit  Medication Dose Route Frequency Provider Last Rate Last Admin  . Mepolizumab SOLR 100 mg  100 mg Subcutaneous Q28 days Kennith Gain, MD   100  mg at 05/23/18 1746    Observations/Objective: Alert, NAD, appropriate mood and affect, resps normal, cn 2-12 intact, moves all 4s, no visible rash or swelling Lab Results  Component Value Date   WBC 5.6 02/15/2018   HGB 14.0 02/15/2018   HCT 40.4 02/15/2018   PLT 256.0 12/01/2017   GLUCOSE 79 12/01/2017   CHOL 134 05/19/2017   TRIG 99.0 05/19/2017   HDL 41.20 05/19/2017   LDLCALC 73 05/19/2017   ALT 11 12/01/2017   AST 15 12/01/2017   NA 138 12/01/2017   K 4.7 12/01/2017   CL 103 12/01/2017   CREATININE 0.81 12/01/2017   BUN 8 12/01/2017   CO2 26 12/01/2017   TSH 5.08 (H) 05/19/2017   Assessment and Plan: See notes  Follow Up Instructions: See notes   I discussed the assessment and treatment plan with the patient. The patient was provided an opportunity to ask questions and all were answered. The patient agreed with the plan and demonstrated an understanding of the instructions.   The patient was advised to call back or seek an in-person evaluation if the symptoms worsen or if the condition fails to improve as anticipated.  Cathlean Cower, MD

## 2019-08-26 ENCOUNTER — Other Ambulatory Visit: Payer: Self-pay | Admitting: Internal Medicine

## 2019-11-20 ENCOUNTER — Other Ambulatory Visit: Payer: Self-pay

## 2019-11-20 ENCOUNTER — Telehealth: Payer: Self-pay

## 2019-11-20 MED ORDER — RIZATRIPTAN BENZOATE 10 MG PO TABS: 10.0000 mg | ORAL_TABLET | ORAL | 0 refills | Status: DC | PRN

## 2019-11-20 NOTE — Telephone Encounter (Signed)
Sent to pharmacy 

## 2019-11-20 NOTE — Telephone Encounter (Signed)
New message   Need prior authorization on  rizatriptan (MAXALT) 10 MG tablet  [id number] - [place], [place] - [address]

## 2019-11-29 ENCOUNTER — Telehealth (INDEPENDENT_AMBULATORY_CARE_PROVIDER_SITE_OTHER): Payer: BC Managed Care – PPO | Admitting: Internal Medicine

## 2019-11-29 DIAGNOSIS — F329 Major depressive disorder, single episode, unspecified: Secondary | ICD-10-CM | POA: Diagnosis not present

## 2019-11-29 DIAGNOSIS — F419 Anxiety disorder, unspecified: Secondary | ICD-10-CM | POA: Diagnosis not present

## 2019-11-29 DIAGNOSIS — J4531 Mild persistent asthma with (acute) exacerbation: Secondary | ICD-10-CM | POA: Diagnosis not present

## 2019-11-29 DIAGNOSIS — F32A Depression, unspecified: Secondary | ICD-10-CM

## 2019-11-29 DIAGNOSIS — J329 Chronic sinusitis, unspecified: Secondary | ICD-10-CM | POA: Diagnosis not present

## 2019-11-29 MED ORDER — PREDNISONE 10 MG PO TABS: ORAL_TABLET | ORAL | 0 refills | Status: DC

## 2019-11-29 MED ORDER — AZITHROMYCIN 250 MG PO TABS: ORAL_TABLET | ORAL | 1 refills | Status: DC

## 2019-11-29 MED ORDER — CITALOPRAM HYDROBROMIDE 40 MG PO TABS: 40.0000 mg | ORAL_TABLET | Freq: Every day | ORAL | 3 refills | Status: DC

## 2019-11-29 NOTE — Progress Notes (Signed)
Patient ID: Valerie [name]. Drier, female   DOB: [birth year], 19 y.o.   MRN: [id number]  Virtual Visit via Video Note  I connected with Joceline R. Perot on 11/29/19 at 10:40 AM EDT by a video enabled telemedicine application and verified that I am speaking with the correct person using two identifiers.  Location: of all participants today Patient: at home Provider: at office   I discussed the limitations of evaluation and management by telemedicine and the availability of in person appointments. The patient expressed understanding and agreed to proceed.  History of Present Illness:  Here with 2-3 days acute onset fever, facial pain, pressure, headache, general weakness and malaise, and greenish d/c, with mild ST and cough, but pt denies chest pain, wheezing, increased sob or doe, orthopnea, PND, increased LE swelling, palpitations, dizziness or syncope, except for wheezing the last 2 days with mild sob.  Has also had mild worsening depressive symptoms, but no suicidal ideation, or panic; has ongoing anxiety, asks for counseling Past Medical History:  Diagnosis Date  . Allergic rhinitis   . Anxiety   . Childhood asthma   . Depression 05/19/2017  . Migraine    No past surgical history on file.  reports that she is a non-smoker but has been exposed to tobacco smoke. She has never used smokeless tobacco. She reports that she does not drink alcohol and does not use drugs. family history includes AAA (abdominal aortic aneurysm) in her maternal grandfather; Alcohol abuse in her maternal grandfather; Arthritis in her maternal grandmother, mother, paternal grandfather, and paternal grandmother; Asthma in her mother; Bone cancer in her maternal grandmother; COPD in her maternal grandfather; Cancer in her maternal grandfather; Depression in her father, mother, and paternal grandmother; Heart disease in her maternal grandmother; Hyperlipidemia in her mother and paternal grandfather; Hypertension in her  paternal grandmother; Stroke in her maternal grandmother. No Known Allergies Current Outpatient Medications on File Prior to Visit  Medication Sig Dispense Refill  . acetaminophen (TYLENOL) 500 MG tablet Take 500 mg by mouth every 6 (six) hours as needed.    Marland Kitchen albuterol (PROVENTIL HFA;VENTOLIN HFA) 108 (90 Base) MCG/ACT inhaler Inhale 2 puffs into the lungs every 6 (six) hours as needed for wheezing or shortness of breath. 18 g 11  . aspirin-acetaminophen-caffeine (EXCEDRIN MIGRAINE) 250-250-65 MG tablet Take 1 tablet by mouth every 6 (six) hours as needed for headache.    . Azelastine-Fluticasone 137-50 MCG/ACT SUSP Place 1 spray into the nose 2 (two) times daily as needed. 23 g 5  . budesonide-formoterol (SYMBICORT) 160-4.5 MCG/ACT inhaler TAKE 2 PUFFS BY MOUTH TWICE A DAY 30.6 Inhaler 3  . ibuprofen (ADVIL,MOTRIN) 200 MG tablet Take 200 mg by mouth every 6 (six) hours as needed.    . LO LOESTRIN FE 1 MG-10 MCG / 10 MCG tablet Take 1 tablet by mouth daily.    . montelukast (SINGULAIR) 10 MG tablet TAKE 1 TABLET BY MOUTH EVERY DAY 90 tablet 0  . NUCALA 100 MG SOLR     . ondansetron (ZOFRAN ODT) 4 MG disintegrating tablet Take 1 tablet (4 mg total) by mouth every 8 (eight) hours as needed for nausea or vomiting. 20 tablet 0  . rizatriptan (MAXALT) 10 MG tablet Take 1 tablet (10 mg total) by mouth as needed for migraine. May repeat in 2 hours if needed 10 tablet 0  . Sod Fluoride-Potassium Nitrate (PREVIDENT 5000 SENSITIVE) 1.1-5 % PSTE PreviDent 5000 Sensitive 1.1 %-5 % dental paste  USE AS DIRECTED 3 TIMES  A DAY. DO NOT SWALLOW    . triamcinolone (KENALOG) 0.1 % paste Use as directed 1 application in the mouth or throat 2 (two) times daily. 5 g 12  . zolpidem (AMBIEN CR) 12.5 MG CR tablet Take 1 tablet (12.5 mg total) by mouth at bedtime as needed for up to 30 days for sleep. 30 tablet 5   Current Facility-Administered Medications on File Prior to Visit  Medication Dose Route Frequency Provider  Last Rate Last Admin  . Mepolizumab SOLR 100 mg  100 mg Subcutaneous Q28 days Marcelyn Bruins, MD   100 mg at 05/23/18 1746    Observations/Objective: Alert, NAD, appropriate mood and affect, resps normal, cn 2-12 intact, moves all 4s, no visible rash or swelling Lab Results  Component Value Date   WBC 5.6 02/15/2018   HGB 14.0 02/15/2018   HCT 40.4 02/15/2018   PLT 256.0 12/01/2017   GLUCOSE 79 12/01/2017   CHOL 134 05/19/2017   TRIG 99.0 05/19/2017   HDL 41.20 05/19/2017   LDLCALC 73 05/19/2017   ALT 11 12/01/2017   AST 15 12/01/2017   NA 138 12/01/2017   K 4.7 12/01/2017   CL 103 12/01/2017   CREATININE 0.81 12/01/2017   BUN 8 12/01/2017   CO2 26 12/01/2017   TSH 5.08 (H) 05/19/2017   Assessment and Plan: [name] notes  Follow Up Instructions: [name] notes   I discussed the assessment and treatment plan with the patient. The patient was provided an opportunity to ask questions and all were answered. The patient agreed with the plan and demonstrated an understanding of the instructions.   The patient was advised to call back or seek an in-person evaluation if the symptoms worsen or if the condition fails to improve as anticipated.   Oliver Barre, MD

## 2019-11-29 NOTE — Patient Instructions (Signed)
Please take all new medication as prescribed  Please continue all other medications as before, and refills have been done if requested.  Please have the pharmacy call with any other refills you may need.  Please continue your efforts at being more active, low cholesterol diet, and weight control.  Please keep your appointments with your specialists as you may have planned  You will be contacted regarding the referral for: counseling

## 2019-12-03 ENCOUNTER — Encounter: Payer: Self-pay | Admitting: Internal Medicine

## 2019-12-03 NOTE — Assessment & Plan Note (Signed)
/  Mild to mod, for predpac asd,  to f/u any worsening symptoms or concerns 

## 2019-12-03 NOTE — Assessment & Plan Note (Signed)
Mild to mod, for celexa 10 qd,  to f/u any worsening symptoms or concerns 

## 2019-12-03 NOTE — Assessment & Plan Note (Addendum)
With acute flare, Mild to mod, for antibx course,  to f/u any worsening symptoms or concerns  I spent 31 minutes in preparing to see the patient by review of recent labs, imaging and procedures, obtaining and reviewing separately obtained history, communicating with the patient and family or caregiver, ordering medications, tests or procedures, and documenting clinical information in the EHR including the differential Dx, treatment, and any further evaluation and other management of sinusitis, asthma exacerbation, anxiety, depression

## 2019-12-03 NOTE — Assessment & Plan Note (Signed)
Mild to mod, for counseling referral, to f/u any worsening symptoms or concerns 

## 2019-12-19 ENCOUNTER — Other Ambulatory Visit: Payer: Self-pay | Admitting: Internal Medicine

## 2020-01-02 ENCOUNTER — Telehealth: Payer: Self-pay

## 2020-01-02 NOTE — Telephone Encounter (Signed)
New message    The patient calling in regards to [facility] paperwork sent on 7.30.21

## 2020-01-04 NOTE — Telephone Encounter (Signed)
done

## 2020-01-04 NOTE — Telephone Encounter (Signed)
Also, I spoke with pt to determine reason for FMLA.  She states she is still feeling SOB after antibiotic completion, coughing up green mucous that in intermittently blood.  States she was diagnosed with COVID x 3 weeks ago.  Denies fever.

## 2020-01-04 NOTE — Telephone Encounter (Signed)
FMLA paperwork completed & to Dr Raphael Gibney desk for review/signature.

## 2020-01-13 ENCOUNTER — Other Ambulatory Visit: Payer: Self-pay | Admitting: Internal Medicine

## 2020-01-15 ENCOUNTER — Telehealth: Payer: Self-pay | Admitting: Internal Medicine

## 2020-01-15 NOTE — Telephone Encounter (Signed)
Neck pain started when woke up 8.23.21 am Tried tylenol no help, tried heating, cooling Hurts to turn head  Migraines/sinus infections often  At what point should she be concerned?

## 2020-01-16 NOTE — Telephone Encounter (Signed)
Of course any unusual symptoms even mild are concerning, but if she means when does she need to consider an office visit or urgent care?  I would go if high fever, severe pain, or other unusual symptoms such as even neck pain and numbness or weakness radiating to the one or both arms would be good reasons, but also for OV if less symptoms that tend to interfere in a significant way with her daily life, either socially or work and cannot be managed well with even otc medications such as advil, tylenol or alleve

## 2020-01-16 NOTE — Telephone Encounter (Signed)
Sent to Dr. John. 

## 2020-01-17 NOTE — Telephone Encounter (Signed)
Spoke with pt and explained to pt Dr. Raphael Gibney response. She has stated that she understands and has no questions or concerns at this time.

## 2020-01-23 ENCOUNTER — Ambulatory Visit (INDEPENDENT_AMBULATORY_CARE_PROVIDER_SITE_OTHER)
Admission: RE | Admit: 2020-01-23 | Discharge: 2020-01-23 | Disposition: A | Discharge status: Home / Self Care | Payer: BC Managed Care – PPO | Source: Ambulatory Visit | Attending: Internal Medicine | Admitting: Internal Medicine

## 2020-01-23 ENCOUNTER — Other Ambulatory Visit: Payer: Self-pay

## 2020-01-23 ENCOUNTER — Ambulatory Visit (INDEPENDENT_AMBULATORY_CARE_PROVIDER_SITE_OTHER): Payer: BC Managed Care – PPO | Admitting: Internal Medicine

## 2020-01-23 ENCOUNTER — Encounter: Payer: Self-pay | Admitting: Internal Medicine

## 2020-01-23 VITALS — BP 120/80 | HR 86 | Temp 98.6°F | Ht 64.0 in | Wt 153.0 lb

## 2020-01-23 DIAGNOSIS — J329 Chronic sinusitis, unspecified: Secondary | ICD-10-CM | POA: Diagnosis not present

## 2020-01-23 DIAGNOSIS — R300 Dysuria: Secondary | ICD-10-CM | POA: Insufficient documentation

## 2020-01-23 DIAGNOSIS — F419 Anxiety disorder, unspecified: Secondary | ICD-10-CM

## 2020-01-23 DIAGNOSIS — R079 Chest pain, unspecified: Secondary | ICD-10-CM | POA: Insufficient documentation

## 2020-01-23 DIAGNOSIS — J4520 Mild intermittent asthma, uncomplicated: Secondary | ICD-10-CM

## 2020-01-23 DIAGNOSIS — J4531 Mild persistent asthma with (acute) exacerbation: Secondary | ICD-10-CM

## 2020-01-23 DIAGNOSIS — M542 Cervicalgia: Secondary | ICD-10-CM | POA: Insufficient documentation

## 2020-01-23 DIAGNOSIS — J45909 Unspecified asthma, uncomplicated: Secondary | ICD-10-CM | POA: Diagnosis not present

## 2020-01-23 DIAGNOSIS — R0602 Shortness of breath: Secondary | ICD-10-CM | POA: Diagnosis not present

## 2020-01-23 DIAGNOSIS — K2100 Gastro-esophageal reflux disease with esophagitis, without bleeding: Secondary | ICD-10-CM

## 2020-01-23 MED ORDER — PREDNISONE 10 MG PO TABS
ORAL_TABLET | ORAL | 0 refills | Status: DC
Start: 2020-01-23 — End: 2020-03-05

## 2020-01-23 MED ORDER — PANTOPRAZOLE SODIUM 40 MG PO TBEC: 40.0000 mg | DELAYED_RELEASE_TABLET | Freq: Every day | ORAL | 3 refills | Status: DC

## 2020-01-23 MED ORDER — METHYLPREDNISOLONE ACETATE 80 MG/ML IJ SUSP
80.0000 mg | Freq: Once | INTRAMUSCULAR | Status: AC
  Administered 2020-01-23: 80 mg via INTRAMUSCULAR

## 2020-01-23 NOTE — Assessment & Plan Note (Addendum)
For cxr as above, suspect GI related, for protonix trial, refer gI - ? Need for egd r/o eosinophilic esophagitis  I spent 41 minutes in addition to time for CPX wellness examination in preparing to see the patient by review of recent labs, imaging and procedures, obtaining and reviewing separately obtained history, communicating with the patient and family or caregiver, ordering medications, tests or procedures, and documenting clinical information in the EHR including the differential Dx, treatment, and any further evaluation and other management of CP, asthma, chronic sinusitis, dysuria, allergies

## 2020-01-23 NOTE — Assessment & Plan Note (Signed)
Also for urine studies, consider antibx pending results

## 2020-01-23 NOTE — Patient Instructions (Addendum)
You had the steroid shot today  Please take all new medication as prescribed - the prednisone, and protonix  Please continue all other medications as before, and refills have been done if requested.  Please have the pharmacy call with any other refills you may need.  Please keep your appointments with your specialists as you may have planned  You will be contacted regarding the referral for: Allergy, pulmonary, and gastroenterology  Please go to the XRAY Department in the first floor for the x-ray testing  Please go to the LAB at the blood drawing area for the tests to be done  You will be contacted by phone if any changes need to be made immediately.  Otherwise, you will receive a letter about your results with an explanation, but please check with MyChart first.  Please remember to sign up for MyChart if you have not done so, as this will be important to you in the future with finding out test results, communicating by private email, and scheduling acute appointments online when needed.

## 2020-01-23 NOTE — Progress Notes (Signed)
Subjective:    Patient ID: Valerie [name], female    DOB: March 07, 2001, 19 y.o.   MRN: [id number]  HPI  Here to f/u with c/o SSCP, dull without radiation, n/v, diaphoresis, palps or dizziness.  Does also have sob with not even able to finish long sentences intermittent despite good med compliance and hx of eosinophilic asthma.  TUMS x 3 at one time no help. Denies worsening other abd pain, dysphagia, n/v, bowel change or blood. S/p covid infection approx 5-6 wks ago but pain started prior to that.  Also incidentally with mild dysuria but Denies urinary symptoms such as frequency, urgency, flank pain, hematuria or n/v, fever, chills.  Denies worsening depressive symptoms, suicidal ideation, or panic; has ongoing anxiety, not increased recently, in fact overall seems improved on current meds.  Has also ongoing chronic sinus and has seen ent in past with mild septal deviation but not felt to really need surgury. Past Medical History:  Diagnosis Date  . Allergic rhinitis   . Anxiety   . Childhood asthma   . Depression 05/19/2017  . Migraine    History reviewed. No pertinent surgical history.  reports that she is a non-smoker but has been exposed to tobacco smoke. She has never used smokeless tobacco. She reports that she does not drink alcohol and does not use drugs. family history includes AAA (abdominal aortic aneurysm) in her maternal grandfather; Alcohol abuse in her maternal grandfather; Arthritis in her maternal grandmother, mother, paternal grandfather, and paternal grandmother; Asthma in her mother; Bone cancer in her maternal grandmother; COPD in her maternal grandfather; Cancer in her maternal grandfather; Depression in her father, mother, and paternal grandmother; Heart disease in her maternal grandmother; Hyperlipidemia in her mother and paternal grandfather; Hypertension in her paternal grandmother; Stroke in her maternal grandmother. No Known Allergies Current Outpatient Medications on  File Prior to Visit  Medication Sig Dispense Refill  . acetaminophen (TYLENOL) 500 MG tablet Take 500 mg by mouth every 6 (six) hours as needed.    Marland Kitchen albuterol (PROVENTIL HFA;VENTOLIN HFA) 108 (90 Base) MCG/ACT inhaler Inhale 2 puffs into the lungs every 6 (six) hours as needed for wheezing or shortness of breath. 18 g 11  . aspirin-acetaminophen-caffeine (EXCEDRIN MIGRAINE) 185-909-31 MG tablet Take 1 tablet by mouth every 6 (six) hours as needed for headache.    . Azelastine-Fluticasone 137-50 MCG/ACT SUSP Place 1 spray into the nose 2 (two) times daily as needed. 23 g 5  . budesonide-formoterol (SYMBICORT) 160-4.5 MCG/ACT inhaler TAKE 2 PUFFS BY MOUTH TWICE A DAY 30.6 Inhaler 3  . citalopram (CELEXA) 40 MG tablet Take 1 tablet (40 mg total) by mouth daily. 90 tablet 3  . ibuprofen (ADVIL,MOTRIN) 200 MG tablet Take 200 mg by mouth every 6 (six) hours as needed.    . ID NOW COVID-19 KIT See admin instructions.    . LO LOESTRIN FE 1 MG-10 MCG / 10 MCG tablet Take 1 tablet by mouth daily.    Marland Kitchen NUCALA 100 MG SOLR     . ondansetron (ZOFRAN ODT) 4 MG disintegrating tablet Take 1 tablet (4 mg total) by mouth every 8 (eight) hours as needed for nausea or vomiting. 20 tablet 0  . rizatriptan (MAXALT) 10 MG tablet TAKE 1 TABLET BY MOUTH AS NEEDED FOR MIGRAINE. MAY REPEAT IN 2 HOURS IF NEEDED 10 tablet 0  . Sod Fluoride-Potassium Nitrate (PREVIDENT 5000 SENSITIVE) 1.1-5 % PSTE PreviDent 5000 Sensitive 1.1 %-5 % dental paste  USE AS DIRECTED 3  TIMES A DAY. DO NOT SWALLOW    . triamcinolone (KENALOG) 0.1 % paste Use as directed 1 application in the mouth or throat 2 (two) times daily. 5 g 12  . zolpidem (AMBIEN CR) 12.5 MG CR tablet Take 1 tablet (12.5 mg total) by mouth at bedtime as needed for up to 30 days for sleep. 30 tablet 5   Current Facility-Administered Medications on File Prior to Visit  Medication Dose Route Frequency Provider Last Rate Last Admin  . Mepolizumab SOLR 100 mg  100 mg Subcutaneous  Q28 days Kennith Gain, MD   100 mg at 05/23/18 1746   Review of Systems All otherwise neg per pt    Objective:   Physical Exam BP 120/80 (BP Location: Left Arm, Patient Position: Sitting, Cuff Size: Large)   Pulse 86   Temp 98.6 F (37 C) (Oral)   Ht _0  (1.626 m)   Wt 153 lb (69.4 kg)   SpO2 98%   BMI 26.26 kg/m  VS noted,  Constitutional: Pt appears in NAD HENT: Head: NCAT.  Right Ear: External ear normal.  Left Ear: External ear normal.  Eyes: . Pupils are equal, round, and reactive to light. Conjunctivae and EOM are normal Nose: without d/c or deformity Neck: Neck supple. Gross normal ROM Cardiovascular: Normal rate and regular rhythm.   Pulmonary/Chest: Effort slight abnormal and breath sounds dcreased without rales or wheezing.  Abd:  Soft, mild tender low mid abd, ND, + BS, no organomegaly Neurological: Pt is alert. At baseline orientation, motor grossly intact Skin: Skin is warm. No rashes, other new lesions, no LE edema Psychiatric: Pt behavior is normal without agitation , mild anxiety All otherwise neg per pt Lab Results  Component Value Date   WBC 5.6 02/15/2018   HGB 14.0 02/15/2018   HCT 40.4 02/15/2018   PLT 256.0 12/01/2017   GLUCOSE 79 12/01/2017   CHOL 134 05/19/2017   TRIG 99.0 05/19/2017   HDL 41.20 05/19/2017   LDLCALC 73 05/19/2017   ALT 11 12/01/2017   AST 15 12/01/2017   NA 138 12/01/2017   K 4.7 12/01/2017   CL 103 12/01/2017   CREATININE 0.81 12/01/2017   BUN 8 12/01/2017   CO2 26 12/01/2017   TSH 5.08 (H) 05/19/2017      Assessment & Plan:

## 2020-01-23 NOTE — Assessment & Plan Note (Signed)
Cont current tx, for allergy referral

## 2020-01-23 NOTE — Assessment & Plan Note (Signed)
stable overall by history and exam, recent data reviewed with pt, and pt to continue medical treatment as before,  to f/u any worsening symptoms or concerns  

## 2020-01-23 NOTE — Assessment & Plan Note (Signed)
With mild exacerbation, for depomedrol IM, predpac asd, cont symbicort, albuterol prn, cxr today, and refer pulmonary

## 2020-01-23 NOTE — Assessment & Plan Note (Signed)
As above.

## 2020-01-24 ENCOUNTER — Encounter: Payer: Self-pay | Admitting: Internal Medicine

## 2020-01-24 LAB — COMPLETE METABOLIC PANEL WITH GFR
AG Ratio: 1.6 (calc) (ref 1.0–2.5)
ALT: 10 U/L (ref 6–29)
AST: 14 U/L (ref 10–30)
Albumin: 4.5 g/dL (ref 3.6–5.1)
Alkaline phosphatase (APISO): 55 U/L (ref 31–125)
BUN: 9 mg/dL (ref 7–25)
CO2: 26 mmol/L (ref 20–32)
Calcium: 10 mg/dL (ref 8.6–10.2)
Chloride: 103 mmol/L (ref 98–110)
Creat: 0.88 mg/dL (ref 0.50–1.10)
GFR, Est African American: 109 mL/min/{1.73_m2} (ref >=60)
GFR, Est Non African American: 94 mL/min/{1.73_m2} (ref >=60)
Globulin: 2.8 g/dL (calc) (ref 1.9–3.7)
Glucose, Bld: 82 mg/dL (ref 65–99)
Potassium: 4.4 mmol/L (ref 3.5–5.3)
Sodium: 137 mmol/L (ref 135–146)
Total Bilirubin: 0.4 mg/dL (ref 0.2–1.2)
Total Protein: 7.3 g/dL (ref 6.1–8.1)

## 2020-01-24 LAB — CBC WITH DIFFERENTIAL/PLATELET
Absolute Monocytes: 410 cells/uL (ref 200–950)
Basophils Absolute: 38 cells/uL (ref 0–200)
Basophils Relative: 0.6 %
Eosinophils Absolute: 480 cells/uL (ref 15–500)
Eosinophils Relative: 7.5 %
HCT: 42.5 % (ref 35.0–45.0)
Hemoglobin: 14.7 g/dL (ref 11.7–15.5)
Lymphs Abs: 2048 cells/uL (ref 850–3900)
MCH: 31.1 pg (ref 27.0–33.0)
MCHC: 34.6 g/dL (ref 32.0–36.0)
MCV: 90 fL (ref 80.0–100.0)
MPV: 10.7 fL (ref 7.5–12.5)
Monocytes Relative: 6.4 %
Neutro Abs: 3424 cells/uL (ref 1500–7800)
Neutrophils Relative %: 53.5 %
Platelets: 262 10*3/uL (ref 140–400)
RBC: 4.72 10*6/uL (ref 3.80–5.10)
RDW: 11.9 % (ref 11.0–15.0)
Total Lymphocyte: 32 %
WBC: 6.4 10*3/uL (ref 3.8–10.8)

## 2020-01-24 LAB — URINALYSIS, ROUTINE W REFLEX MICROSCOPIC
Bilirubin Urine: NEGATIVE
Glucose, UA: NEGATIVE
Hgb urine dipstick: NEGATIVE
Ketones, ur: NEGATIVE
Leukocytes,Ua: NEGATIVE
Nitrite: NEGATIVE
Protein, ur: NEGATIVE
Specific Gravity, Urine: 1.011 (ref 1.001–1.03)
pH: 5.5 (ref 5.0–8.0)

## 2020-01-24 LAB — TSH: TSH: 4.25 mIU/L

## 2020-01-24 LAB — URINE CULTURE: Result:: NO GROWTH

## 2020-01-29 ENCOUNTER — Ambulatory Visit: Payer: BC Managed Care – PPO | Admitting: Psychology

## 2020-02-26 DIAGNOSIS — Z6825 Body mass index (BMI) 25.0-25.9, adult: Secondary | ICD-10-CM | POA: Diagnosis not present

## 2020-02-26 DIAGNOSIS — Z113 Encounter for screening for infections with a predominantly sexual mode of transmission: Secondary | ICD-10-CM | POA: Diagnosis not present

## 2020-02-26 DIAGNOSIS — Z01419 Encounter for gynecological examination (general) (routine) without abnormal findings: Secondary | ICD-10-CM | POA: Diagnosis not present

## 2020-03-05 ENCOUNTER — Other Ambulatory Visit: Payer: Self-pay

## 2020-03-05 ENCOUNTER — Ambulatory Visit: Payer: BC Managed Care – PPO | Admitting: Allergy

## 2020-03-05 ENCOUNTER — Encounter: Payer: Self-pay | Admitting: Allergy

## 2020-03-05 VITALS — BP 106/82 | HR 72 | Temp 97.7°F | Resp 14 | Ht 65.3 in | Wt 158.8 lb

## 2020-03-05 DIAGNOSIS — J0100 Acute maxillary sinusitis, unspecified: Secondary | ICD-10-CM | POA: Diagnosis not present

## 2020-03-05 DIAGNOSIS — J4540 Moderate persistent asthma, uncomplicated: Secondary | ICD-10-CM

## 2020-03-05 DIAGNOSIS — J310 Chronic rhinitis: Secondary | ICD-10-CM | POA: Diagnosis not present

## 2020-03-05 MED ORDER — AMOXICILLIN-POT CLAVULANATE 875-125 MG PO TABS: ORAL_TABLET | ORAL | 0 refills | Status: DC

## 2020-03-05 NOTE — Progress Notes (Signed)
Follow-up Note  RE: Valerie [name]. [name] MRN: [id number] DOB: 09-06-2000 Date of Office Visit: 03/05/2020   History of present illness: Valerie [name] is a 19 y.o. female presenting today for follow-up/sick visit.  Her last visit was a telemedicine visit on 09/29/2018 by myself.  She states over the past week she has been having sinus pressure and pain in her cheeks and sometimes her forehead as well as increase in nasal congestion.  She states she has a foul smell from her nose.  She does not states symptoms are improving at this point in time.  She has been using Dymista without much improvement.  She also states that this has been affecting her breathing and she has needed to use her albuterol at least 3 times in the past week which is an increase in use and is using it for issues of cough or shortness of breath.  She does continue to take her Symbicort 2 puffs twice a day and singular daily.  She was on nucala injections but states she has been off of the past 8 months or so.  She states her insurance switched and this became too costly for her.  She states it was going to be costing her about $1000 with each injection which is not doable for her.  She states since being off of the Nucala she has had more symptoms and poor control.  She states she did have better control when she was on this biologic as well as decreased need of her inhaler.  She states just this year she has had about 3-4 prednisone burst for asthma flares due to respiratory illness. She states she is taking cetirizine most days and does use her Dymista spray which does help typically with nasal congestion and drainage control.     Review of systems: Review of Systems  Constitutional: Positive for malaise/fatigue.  HENT: Positive for congestion.   Eyes: Negative.   Respiratory: Positive for cough, shortness of breath and wheezing.   Cardiovascular: Negative.   Gastrointestinal: Negative.   Musculoskeletal: Negative.     Skin: Negative.   Neurological: Negative.     All other systems negative unless noted above in HPI  Past medical/social/surgical/family history have been reviewed and are unchanged unless specifically indicated below.  No changes  Medication List: Current Outpatient Medications  Medication Sig Dispense Refill  . acetaminophen (TYLENOL) 500 MG tablet Take 500 mg by mouth every 6 (six) hours as needed.    Marland Kitchen albuterol (PROVENTIL HFA;VENTOLIN HFA) 108 (90 Base) MCG/ACT inhaler Inhale 2 puffs into the lungs every 6 (six) hours as needed for wheezing or shortness of breath. 18 g 11  . Azelastine-Fluticasone 137-50 MCG/ACT SUSP Place 1 spray into the nose 2 (two) times daily as needed. 23 g 5  . budesonide-formoterol (SYMBICORT) 160-4.5 MCG/ACT inhaler TAKE 2 PUFFS BY MOUTH TWICE A DAY 30.6 Inhaler 3  . citalopram (CELEXA) 40 MG tablet Take 1 tablet (40 mg total) by mouth daily. 90 tablet 3  . ibuprofen (ADVIL,MOTRIN) 200 MG tablet Take 200 mg by mouth every 6 (six) hours as needed.    . LO LOESTRIN FE 1 MG-10 MCG / 10 MCG tablet Take 1 tablet by mouth daily.    . montelukast (SINGULAIR) 10 MG tablet Take 10 mg by mouth at bedtime.    . ondansetron (ZOFRAN ODT) 4 MG disintegrating tablet Take 1 tablet (4 mg total) by mouth every 8 (eight) hours as needed for nausea or vomiting. 20  tablet 0  . rizatriptan (MAXALT) 10 MG tablet TAKE 1 TABLET BY MOUTH AS NEEDED FOR MIGRAINE. MAY REPEAT IN 2 HOURS IF NEEDED 10 tablet 0  . Sod Fluoride-Potassium Nitrate (PREVIDENT 5000 SENSITIVE) 1.1-5 % PSTE PreviDent 5000 Sensitive 1.1 %-5 % dental paste  USE AS DIRECTED 3 TIMES A DAY. DO NOT SWALLOW    . triamcinolone (KENALOG) 0.1 % paste Use as directed 1 application in the mouth or throat 2 (two) times daily. 5 g 12  . amoxicillin-clavulanate (AUGMENTIN) 875-125 MG tablet Take one tablet by mouth twice daily for ten days. 20 tablet 0  . zolpidem (AMBIEN CR) 12.5 MG CR tablet Take 1 tablet (12.5 mg total) by  mouth at bedtime as needed for up to 30 days for sleep. 30 tablet 5   No current facility-administered medications for this visit.     Known medication allergies: No Known Allergies   Physical examination: Blood pressure 106/82, pulse 72, temperature 97.7 F (36.5 C), temperature source Tympanic, resp. rate 14, height 5' 5.3" (1.659 m), weight 158 lb 12.8 oz (72 kg).  General: Alert, interactive, in no acute distress. HEENT: PERRLA, TMs pearly gray, turbinates moderately edematous without discharge, post-pharynx non erythematous. Neck: Supple without lymphadenopathy. Lungs: Clear to auscultation without wheezing, rhonchi or rales. {no increased work of breathing. CV: Normal S1, S2 without murmurs. Abdomen: Nondistended, nontender. Skin: Warm and dry, without lesions or rashes. Extremities:  No clubbing, cyanosis or edema. Neuro:   Grossly intact.  Diagnositics/Labs: None today  Assessment and plan:   Acute sinusitis with asthma exacerbation  - symptoms consistent with sinus infection and given duration of symptoms will treat with Augmentin 875mg  1 tab twice a day for 10 days  - if symptoms not improving in the next ~4 days then would recommend taking the prednisone 5 day burst provided  - can take Mucinex DM to help thin mucus and help with cough  - recommend performing nasal saline rinses daily at this time  Asthma, mild persistent   - continue Symbicort 2 puffs twice a day   - continue singulair 10mg  daily   - will have Tammy look into your new insurance to see what options you have in regards to restarting on biologic asthma medication   Asthma control goals:   Full participation in all desired activities (may need albuterol before activity)  Albuterol use two time or less a week on average (not counting use with activity)  Cough interfering with sleep two time or less a month  Oral steroids no more than once a year  No hospitalizations  Rhinitis,  nonallergic   - environmental allergy testing has been negative by skin testing and serum IgE testing   - continue taking daily antihistamine like Allegra 180mg , Xyzal 5mg  or Zyrtec 10mg  daily as needed if finding a benefit   - singulair as above   - for nasal congestion/drainage continue using Dymista 1 spray each nostril twice a day as needed.     Follow-up 4-6 months or sooner if needed  I appreciate the opportunity to take part in [place] care. Please do not hesitate to contact me with questions.  Sincerely,   , MD Allergy/Immunology [facility]

## 2020-03-05 NOTE — Patient Instructions (Addendum)
Acute sinusitis with asthma exacerbation  - symptoms consistent with sinus infection and given duration of symptoms will treat with Augmentin 875mg  1 tab twice a day for 10 days  - if symptoms not improving in the next ~4 days then would recommend taking the prednisone 5 day burst provided  - can take Mucinex DM to help thin mucus and help with cough  - recommend performing nasal saline rinses daily at this time  Asthma   - continue Symbicort 2 puffs twice a day   - continue singulair 10mg  daily   - will have Tammy look into your new insurance to see what options you have in regards to restarting on biologic asthma medication   Asthma control goals:   Full participation in all desired activities (may need albuterol before activity)  Albuterol use two time or less a week on average (not counting use with activity)  Cough interfering with sleep two time or less a month  Oral steroids no more than once a year  No hospitalizations  Rhinitis, nonallergic   - environmental allergy testing has been negative by skin testing and serum IgE testing   - continue taking daily antihistamine like Allegra 180mg , Xyzal 5mg  or Zyrtec 10mg  daily as needed if finding a benefit   - singulair as above   - for nasal congestion/drainage continue using Dymista 1 spray each nostril twice a day as needed.     Follow-up 4-6 months or sooner if needed

## 2020-03-13 ENCOUNTER — Other Ambulatory Visit: Payer: Self-pay | Admitting: Internal Medicine

## 2020-03-13 ENCOUNTER — Telehealth: Payer: Self-pay | Admitting: *Deleted

## 2020-03-13 NOTE — Telephone Encounter (Signed)
Called patient to advise approval and submit for Nucala autoinjector to [employer]. Also since copay card has expired faxing enrollment to [employer] [facility] [employer] for them to email patient for esign so she can get copay card which will make her copay $0 out of pocket for drug

## 2020-03-13 NOTE — Telephone Encounter (Signed)
-----   Message from [facility] Larose Hires, MD sent at 03/05/2020  4:46 PM EDT ----- Regarding: nucala Hi Valerie [name],    Patient seen today.  She states she has not been on Nucala in about 8 months or so due to her insurance changing.  She states she could no longer afford the medication.  She states it was going to cost her around thousand dollars per month to continue on it and this was not doable for her.  Can you look into her current insurance in see if that is the case? Could Dupixent be a more affordable option for her? She has tried [place] and we changed her to [place] thus really Dupixent would be the other alternative if the Nucala is no longer an option for her due to cost.  Thanks!

## 2020-04-21 ENCOUNTER — Institutional Professional Consult (permissible substitution): Payer: BC Managed Care – PPO | Admitting: Pulmonary Disease

## 2020-04-29 ENCOUNTER — Ambulatory Visit: Payer: BC Managed Care – PPO | Admitting: Internal Medicine

## 2020-06-23 ENCOUNTER — Ambulatory Visit
Admission: EM | Admit: 2020-06-23 | Discharge: 2020-06-23 | Disposition: A | Discharge status: Home / Self Care | Payer: BC Managed Care – PPO | Attending: Emergency Medicine | Admitting: Emergency Medicine

## 2020-06-23 ENCOUNTER — Ambulatory Visit (INDEPENDENT_AMBULATORY_CARE_PROVIDER_SITE_OTHER): Payer: BC Managed Care – PPO

## 2020-06-23 ENCOUNTER — Other Ambulatory Visit: Payer: Self-pay

## 2020-06-23 DIAGNOSIS — Z1152 Encounter for screening for COVID-19: Secondary | ICD-10-CM

## 2020-06-23 DIAGNOSIS — R059 Cough, unspecified: Secondary | ICD-10-CM

## 2020-06-23 DIAGNOSIS — R062 Wheezing: Secondary | ICD-10-CM | POA: Diagnosis not present

## 2020-06-23 DIAGNOSIS — J209 Acute bronchitis, unspecified: Secondary | ICD-10-CM

## 2020-06-23 DIAGNOSIS — R0602 Shortness of breath: Secondary | ICD-10-CM | POA: Diagnosis not present

## 2020-06-23 DIAGNOSIS — R0789 Other chest pain: Secondary | ICD-10-CM | POA: Diagnosis not present

## 2020-06-23 MED ORDER — PREDNISONE 20 MG PO TABS: 40.0000 mg | ORAL_TABLET | Freq: Every day | ORAL | 0 refills | Status: AC

## 2020-06-23 MED ORDER — DM-GUAIFENESIN ER 30-600 MG PO TB12: 1.0000 | ORAL_TABLET | Freq: Two times a day (BID) | ORAL | 0 refills | Status: DC

## 2020-06-23 MED ORDER — BENZONATATE 200 MG PO CAPS: 200.0000 mg | ORAL_CAPSULE | Freq: Three times a day (TID) | ORAL | 0 refills | Status: AC | PRN

## 2020-06-23 NOTE — Discharge Instructions (Addendum)
No pneumonia on chest x-ray, treating for bronchitis Tessalon every 8 hours for cough Supplement with Mucinex DM to further help with cough and congestion Continue albuterol inhaler as needed for shortness of breath chest tightness and wheezing If not fully resolving may begin prednisone course Rest and fluids Honey and hot tea Follow-up if not improving or worsening

## 2020-06-23 NOTE — ED Provider Notes (Signed)
[facility]    CSN: [id number] Arrival date & time: 06/23/20  1000      History   Chief Complaint Chief Complaint  Patient presents with  . Cough    HPI Valerie [name] is a 20 y.o. female presenting today for evaluation of cough and congestion. Symptoms began Friday, worsened yesterday and today. Currently day 4. Denies fevers. Feels similar to prior pneumonia/bronchitis. Denies COVID exposures. 2 at home tests which were negative.  Reports some blood-tinged sputum which has been green and thick.  Using albuterol and Symbicort.  Patient wishes to defer steroids if possible.  HPI  Past Medical History:  Diagnosis Date  . Allergic rhinitis   . Anxiety   . Childhood asthma   . Depression 05/19/2017  . Migraine     Patient Active Problem List   Diagnosis Date Noted  . Neck pain 01/23/2020  . Chest pain 01/23/2020  . Dysuria 01/23/2020  . Other fatigue 04/28/2019  . Chronic sinusitis 04/28/2019  . Insomnia 11/13/2018  . Aphthous ulcer of mouth 11/13/2018  . Deviated nasal septum 11/13/2018  . Asthma exacerbation 12/27/2017  . Ear pain, bilateral 12/01/2017  . Cough 11/16/2017  . Urinary tract infection without hematuria 05/22/2017  . Depression 05/19/2017  . Encounter for well adult exam with abnormal findings 05/19/2017  . Mild intermittent asthma   . Allergic rhinitis   . Migraine   . Anxiety     History reviewed. No pertinent surgical history.  OB History   No obstetric history on file.      Home Medications    Prior to Admission medications   Medication Sig Start Date End Date Taking? Authorizing Provider  benzonatate (TESSALON) 200 MG capsule Take 1 capsule (200 mg total) by mouth 3 (three) times daily as needed for up to 7 days for cough. 06/23/20 06/30/20 Yes Kathlee Barnhardt C, PA-C  dextromethorphan-guaiFENesin (MUCINEX DM) 30-600 MG 12hr tablet Take 1 tablet by mouth 2 (two) times daily. 06/23/20  Yes Ules Marsala C, PA-C   predniSONE (DELTASONE) 20 MG tablet Take 2 tablets (40 mg total) by mouth daily with breakfast for 5 days. 06/23/20 06/28/20 Yes Crystalee Ventress C, PA-C  acetaminophen (TYLENOL) 500 MG tablet Take 500 mg by mouth every 6 (six) hours as needed.    [provider]  albuterol (PROVENTIL HFA;VENTOLIN HFA) 108 (90 Base) MCG/ACT inhaler Inhale 2 puffs into the lungs every 6 (six) hours as needed for wheezing or shortness of breath. 12/27/17   Corwin Levins, MD  Azelastine-Fluticasone [phone] MCG/ACT SUSP Place 1 spray into the nose 2 (two) times daily as needed. 02/15/18   Marcelyn Bruins, MD  budesonide-formoterol [facility]) 160-4.5 MCG/ACT inhaler TAKE 2 PUFFS BY MOUTH TWICE A DAY 08/22/19   Corwin Levins, MD  citalopram (CELEXA) 40 MG tablet Take 1 tablet (40 mg total) by mouth daily. 11/29/19   Corwin Levins, MD  ibuprofen (ADVIL,MOTRIN) 200 MG tablet Take 200 mg by mouth every 6 (six) hours as needed.    [provider]  montelukast (SINGULAIR) 10 MG tablet Take 10 mg by mouth at bedtime.    [provider]  ondansetron (ZOFRAN ODT) 4 MG disintegrating tablet Take 1 tablet (4 mg total) by mouth every 8 (eight) hours as needed for nausea or vomiting. 03/21/19   Hall-Potvin, [place], PA-C  rizatriptan (MAXALT) 10 MG tablet TAKE 1 TABLET BY MOUTH AS NEEDED FOR MIGRAINE. MAY REPEAT IN 2 HOURS IF NEEDED 03/13/20  Corwin Levins, MD  Sod Fluoride-Potassium Nitrate (PREVIDENT 5000 SENSITIVE) 1.1-5 % PSTE PreviDent 5000 Sensitive 1.1 %-5 % dental paste  USE AS DIRECTED 3 TIMES A DAY. DO NOT SWALLOW    [provider]  triamcinolone (KENALOG) 0.1 % paste Use as directed 1 application in the mouth or throat 2 (two) times daily. 11/13/18   Corwin Levins, MD    Family History Family History  Problem Relation Age of Onset  . Arthritis Mother   . Asthma Mother   . Depression Mother   . Hyperlipidemia Mother   . Depression Father   . Arthritis Maternal Grandmother   .  Bone cancer Maternal Grandmother   . Heart disease Maternal Grandmother   . Stroke Maternal Grandmother   . Alcohol abuse Maternal Grandfather   . COPD Maternal Grandfather   . Cancer Maternal Grandfather   . AAA (abdominal aortic aneurysm) Maternal Grandfather   . Arthritis Paternal Grandmother   . Depression Paternal Grandmother   . Hypertension Paternal Grandmother   . Arthritis Paternal Grandfather   . Hyperlipidemia Paternal Grandfather     Social History Social History   Tobacco Use  . Smoking status: Passive Smoke Exposure - Never Smoker  . Smokeless tobacco: Never Used  . Tobacco comment: mom smokes both inside and outside  Vaping Use  . Vaping Use: Former  . Devices: 3 years ago, for about 2 months and then she quit  Substance Use Topics  . Alcohol use: No  . Drug use: No     Allergies   Patient has no known allergies.   Review of Systems Review of Systems  Constitutional: Positive for fatigue. Negative for activity change, appetite change, chills and fever.  HENT: Positive for congestion and sore throat. Negative for ear pain, rhinorrhea, sinus pressure and trouble swallowing.   Eyes: Negative for discharge and redness.  Respiratory: Positive for cough, shortness of breath and wheezing. Negative for chest tightness.   Cardiovascular: Negative for chest pain.  Gastrointestinal: Negative for abdominal pain, diarrhea, nausea and vomiting.  Musculoskeletal: Negative for myalgias.  Skin: Negative for rash.  Neurological: Negative for dizziness, light-headedness and headaches.     Physical Exam Triage Vital Signs ED Triage Vitals  Enc Vitals Group     BP      Pulse      Resp      Temp      Temp src      SpO2      Weight      Height      Head Circumference      Peak Flow      Pain Score      Pain Loc      Pain Edu?      Excl. in GC?    No data found.  Updated Vital Signs BP 120/80 (BP Location: Left Arm)   Pulse 84   Temp 98.3 F (36.8 C)  (Oral)   Resp 18   LMP 06/20/2020   SpO2 98%   Visual Acuity Right Eye Distance:   Left Eye Distance:   Bilateral Distance:    Right Eye Near:   Left Eye Near:    Bilateral Near:     Physical Exam Vitals and nursing note reviewed.  Constitutional:      Appearance: She is well-developed and well-nourished.     Comments: No acute distress  HENT:     Head: Normocephalic and atraumatic.     Ears:  Comments: Bilateral ears without tenderness to palpation of external auricle, tragus and mastoid, EAC's without erythema or swelling, TM's with good bony landmarks and cone of light. Non erythematous.     Nose: Nose normal.     Mouth/Throat:     Comments: Oral mucosa pink and moist, no tonsillar enlargement or exudate. Posterior pharynx patent and nonerythematous, no uvula deviation or swelling. Normal phonation. Eyes:     Conjunctiva/sclera: Conjunctivae normal.  Cardiovascular:     Rate and Rhythm: Normal rate.  Pulmonary:     Effort: Pulmonary effort is normal. No respiratory distress.     Comments: Breathing comfortably at rest, CTABL, no wheezing, rales or other adventitious sounds auscultated Abdominal:     General: There is no distension.  Musculoskeletal:        General: Normal range of motion.     Cervical back: Neck supple.  Skin:    General: Skin is warm and dry.  Neurological:     Mental Status: She is alert and oriented to person, place, and time.  Psychiatric:        Mood and Affect: Mood and affect normal.      UC Treatments / Results  Labs (all labs ordered are listed, but only abnormal results are displayed) Labs Reviewed  NOVEL CORONAVIRUS, NAA    EKG   Radiology DG Chest 2 View  Result Date: 06/23/2020 CLINICAL DATA:  Cough, shortness of breath, wheezing and chest discomfort. Productive cough. EXAM: CHEST - 2 VIEW COMPARISON:  01/23/2020 FINDINGS: Heart and mediastinal shadows are normal. There is mild central bronchial thickening suggesting  bronchitis. No infiltrate, collapse or effusion. No bone abnormality. IMPRESSION: Possible bronchitis. No consolidation or collapse. Electronically Signed   By: Paulina Fusi M.D.   On: 06/23/2020 10:52    Procedures Procedures (including critical care time)  Medications Ordered in UC Medications - No data to display  Initial Impression / Assessment and Plan / UC Course  I have reviewed the triage vital signs and the nursing notes.  Pertinent labs & imaging results that were available during my care of the patient were reviewed by me and considered in my medical decision making (see chart for details).     Bronchitis-Covid PCR pending to confirm at home tests which were negative.  No signs of pneumonia.  Recommending continued symptomatic and supportive care, already has albuterol inhaler at home, deferring steroids for now, but prednisone course sent if still having wheezing/significant chest congestion without relief from inhaler alone.  Tessalon and Mucinex DM for cough and congestion.  Discussed strict return precautions. Patient verbalized understanding and is agreeable with plan.  Final Clinical Impressions(s) / UC Diagnoses   Final diagnoses:  Encounter for screening for COVID-19  Acute bronchitis, unspecified organism     Discharge Instructions     No pneumonia on chest x-ray, treating for bronchitis Tessalon every 8 hours for cough Supplement with Mucinex DM to further help with cough and congestion Continue albuterol inhaler as needed for shortness of breath chest tightness and wheezing If not fully resolving may begin prednisone course Rest and fluids Honey and hot tea Follow-up if not improving or worsening    ED Prescriptions    Medication Sig Dispense Auth. Provider   benzonatate (TESSALON) 200 MG capsule Take 1 capsule (200 mg total) by mouth 3 (three) times daily as needed for up to 7 days for cough. 28 capsule Vanellope Passmore C, PA-C    dextromethorphan-guaiFENesin (MUCINEX DM) 30-600 MG 12hr tablet Take  1 tablet by mouth 2 (two) times daily. 20 tablet Karder Goodin C, PA-C   predniSONE (DELTASONE) 20 MG tablet Take 2 tablets (40 mg total) by mouth daily with breakfast for 5 days. 10 tablet Roneshia Drew, [place] C, PA-C     PDMP not reviewed this encounter.   Lew Dawes, PA-C 06/23/20 1105

## 2020-06-23 NOTE — ED Triage Notes (Signed)
Pt c/o productive cough with thick greenish sputum with blood at times. States feels like she has fluid on her lungs. Pt c/o SOB, wheezing, and chest congestion. States hx of PNA and asthma. States has had 2 neg covid home test.

## 2020-06-25 LAB — NOVEL CORONAVIRUS, NAA: SARS-CoV-2, NAA: DETECTED — AB

## 2020-06-25 LAB — SARS-COV-2, NAA 2 DAY TAT

## 2020-06-26 ENCOUNTER — Telehealth: Payer: Self-pay

## 2020-06-26 NOTE — Telephone Encounter (Signed)
Called to discuss with patient about COVID-19 symptoms and the use of one of the available treatments for those with mild to moderate Covid symptoms and at a high risk of hospitalization.  Pt appears to qualify for outpatient treatment due to co-morbid conditions and/or a member of an at-risk group in accordance with the FDA Emergency Use Authorization.    Symptom onset: 06/20/20 Vaccinated: x 1 Booster? No Immunocompromised? No Qualifiers: Asthma  Pt. Declines further treatment. States she is feeling better.  Esther Hardy

## 2020-07-10 DIAGNOSIS — Z20822 Contact with and (suspected) exposure to covid-19: Secondary | ICD-10-CM | POA: Diagnosis not present

## 2020-07-10 DIAGNOSIS — Z03818 Encounter for observation for suspected exposure to other biological agents ruled out: Secondary | ICD-10-CM | POA: Diagnosis not present

## 2020-07-13 ENCOUNTER — Other Ambulatory Visit: Payer: Self-pay | Admitting: Internal Medicine

## 2020-08-12 ENCOUNTER — Other Ambulatory Visit: Payer: Self-pay | Admitting: Internal Medicine

## 2020-08-26 ENCOUNTER — Other Ambulatory Visit: Payer: Self-pay | Admitting: Allergy

## 2020-09-12 ENCOUNTER — Other Ambulatory Visit: Payer: Self-pay | Admitting: Internal Medicine

## 2020-09-23 DIAGNOSIS — D225 Melanocytic nevi of trunk: Secondary | ICD-10-CM | POA: Diagnosis not present

## 2020-09-23 DIAGNOSIS — D2262 Melanocytic nevi of left upper limb, including shoulder: Secondary | ICD-10-CM | POA: Diagnosis not present

## 2020-09-23 DIAGNOSIS — D2261 Melanocytic nevi of right upper limb, including shoulder: Secondary | ICD-10-CM | POA: Diagnosis not present

## 2020-10-13 ENCOUNTER — Other Ambulatory Visit: Payer: Self-pay

## 2020-10-13 ENCOUNTER — Ambulatory Visit
Admission: EM | Admit: 2020-10-13 | Discharge: 2020-10-13 | Disposition: A | Discharge status: Home / Self Care | Payer: BC Managed Care – PPO | Attending: Emergency Medicine | Admitting: Emergency Medicine

## 2020-10-13 DIAGNOSIS — J324 Chronic pansinusitis: Secondary | ICD-10-CM

## 2020-10-13 MED ORDER — AMOXICILLIN-POT CLAVULANATE 875-125 MG PO TABS: 1.0000 | ORAL_TABLET | Freq: Two times a day (BID) | ORAL | 0 refills | Status: DC

## 2020-10-13 MED ORDER — PREDNISONE 10 MG (21) PO TBPK: ORAL_TABLET | ORAL | 0 refills | Status: DC

## 2020-10-13 NOTE — ED Triage Notes (Signed)
Pt c/o sinus pressure and nasal congestion x3 months. States hx of sinus infections. States sinus pressure is causing headaches.

## 2020-10-13 NOTE — Discharge Instructions (Signed)
Start Claritin-D, Allegra-D, or Zyrtec-D at first.  If this does not help, discontinue this and start Mucinex-D.  Finish the prednisone and the antibiotics, even if you feel better.  Continue your fluticasone asal spray on a regular basis.  You may take 600 mg of motrin with 1000 mg of tylenol up to 3-4 times a day as needed for pain. This is an effective combination for pain.  Use a NeilMed sinus rinse with distilled water as often as you want to to reduce nasal congestion. Follow the directions on the box.   Go to www.goodrx.com to look up your medications. This will give you a list of where you can find your prescriptions at the most affordable prices. Or you can ask the pharmacist what the cash price is. This is frequently cheaper than going through insurance.

## 2020-10-13 NOTE — ED Provider Notes (Signed)
HPI  SUBJECTIVE:  Valerie [name] is a 20 y.o. female who presents with 3 months of sinus pain and pressure, nasal congestion, thick, green, yellowish rhinorrhea, postnasal drip, cough.  She reports intermittent allergy symptoms.  No fevers, upper dental pain, facial swelling, wheezing.  No antibiotics in the past month.  No antipyretic in the past 6 hours.  She has tried Zyrtec, Synex, sinus rinse and Dymista which is prescribed to her for sinusitis.  No alleviating factors.  Symptoms worse with going outside and at night.  She has a past medical history of seasonal allergies for which she takes Zyrtec and Dymista, asthma, chronic sinusitis, COVID.  No history of diabetes, hypertension.  LMP 3 months ago.  Denies possibility of being pregnant.  DJS:HFWY, Len Blalock, MD   Past Medical History:  Diagnosis Date  . Allergic rhinitis   . Anxiety   . Childhood asthma   . Depression 05/19/2017  . Migraine     History reviewed. No pertinent surgical history.  Family History  Problem Relation Age of Onset  . Arthritis Mother   . Asthma Mother   . Depression Mother   . Hyperlipidemia Mother   . Depression Father   . Arthritis Maternal Grandmother   . Bone cancer Maternal Grandmother   . Heart disease Maternal Grandmother   . Stroke Maternal Grandmother   . Alcohol abuse Maternal Grandfather   . COPD Maternal Grandfather   . Cancer Maternal Grandfather   . AAA (abdominal aortic aneurysm) Maternal Grandfather   . Arthritis Paternal Grandmother   . Depression Paternal Grandmother   . Hypertension Paternal Grandmother   . Arthritis Paternal Grandfather   . Hyperlipidemia Paternal Grandfather     Social History   Tobacco Use  . Smoking status: Passive Smoke Exposure - Never Smoker  . Smokeless tobacco: Never Used  . Tobacco comment: mom smokes both inside and outside  Vaping Use  . Vaping Use: Former  . Devices: 3 years ago, for about 2 months and then she quit  Substance Use  Topics  . Alcohol use: Yes    Comment: 0cc  . Drug use: No    No current facility-administered medications for this encounter.  Current Outpatient Medications:  .  amoxicillin-clavulanate (AUGMENTIN) 875-125 MG tablet, Take 1 tablet by mouth 2 (two) times daily. X 7 days, Disp: 14 tablet, Rfl: 0 .  predniSONE (STERAPRED UNI-PAK 21 TAB) 10 MG (21) TBPK tablet, Dispense one 6 day pack. Take as directed with food., Disp: 21 tablet, Rfl: 0 .  acetaminophen (TYLENOL) 500 MG tablet, Take 500 mg by mouth every 6 (six) hours as needed., Disp: , Rfl:  .  albuterol (PROVENTIL HFA;VENTOLIN HFA) 108 (90 Base) MCG/ACT inhaler, Inhale 2 puffs into the lungs every 6 (six) hours as needed for wheezing or shortness of breath., Disp: 18 g, Rfl: 11 .  Azelastine-Fluticasone 137-50 MCG/ACT SUSP, Place 1 spray into the nose 2 (two) times daily as needed., Disp: 23 g, Rfl: 5 .  budesonide-formoterol (SYMBICORT) 160-4.5 MCG/ACT inhaler, TAKE 2 PUFFS BY MOUTH TWICE A DAY, Disp: 30.6 Inhaler, Rfl: 3 .  citalopram (CELEXA) 40 MG tablet, Take 1 tablet (40 mg total) by mouth daily., Disp: 90 tablet, Rfl: 3 .  montelukast (SINGULAIR) 10 MG tablet, Take 10 mg by mouth at bedtime., Disp: , Rfl:  .  ondansetron (ZOFRAN ODT) 4 MG disintegrating tablet, Take 1 tablet (4 mg total) by mouth every 8 (eight) hours as needed for nausea or vomiting., Disp:  20 tablet, Rfl: 0 .  rizatriptan (MAXALT) 10 MG tablet, TAKE 1 TABLET BY MOUTH AS NEEDED FOR MIGRAINE. MAY REPEAT IN 2 HOURS IF NEEDED, Disp: 10 tablet, Rfl: 0 .  Sod Fluoride-Potassium Nitrate (PREVIDENT 5000 SENSITIVE) 1.1-5 % PSTE, PreviDent 5000 Sensitive 1.1 %-5 % dental paste  USE AS DIRECTED 3 TIMES A DAY. DO NOT SWALLOW, Disp: , Rfl:  .  triamcinolone (KENALOG) 0.1 % paste, Use as directed 1 application in the mouth or throat 2 (two) times daily., Disp: 5 g, Rfl: 12  No Known Allergies   ROS  As noted in HPI.   Physical Exam  BP 118/80 (BP Location: Left Arm)    Pulse 75   Temp 97.6 F (36.4 C)   Resp 16   LMP 10/10/2020   SpO2 98%   Constitutional: Well developed, well nourished, no acute distress Eyes:  EOMI, conjunctiva normal bilaterally HENT: Normocephalic, atraumatic,mucus membranes moist.  Positive maxillary, frontal sinus tenderness.  Positive erythematous, swollen turbinates with purulent nasal congestion.  No obvious postnasal drip Respiratory: Normal inspiratory effort Cardiovascular: Normal rate GI: nondistended skin: No rash, skin intact Musculoskeletal: no deformities Neurologic: Alert & oriented x 3, no focal neuro deficits Psychiatric: Speech and behavior appropriate   ED Course   Medications - No data to display  No orders of the defined types were placed in this encounter.   No results found for this or any previous visit (from the past 24 hour(s)). No results found.  ED Clinical Impression  1. Chronic pansinusitis      ED Assessment/Plan   Patient with a chronic sinusitis.  Will not recheck for COVID as her symptoms have been going on for 3 months.  continue fluticasone nasal spray, start regular saline nasal irrigation, antihistamine/decongestant combination, and if that does not work, she will discontinue that and start Mucinex D instead.  Augmentin, prednisone Dosepak, follow-up with [facility], or she may follow-up with her ENT.  Discussed medical decision making, treatment plan and plan for follow-up with patient.  She agrees with plan  Meds ordered this encounter  Medications  . amoxicillin-clavulanate (AUGMENTIN) 875-125 MG tablet    Sig: Take 1 tablet by mouth 2 (two) times daily. X 7 days    Dispense:  14 tablet    Refill:  0  . predniSONE (STERAPRED UNI-PAK 21 TAB) 10 MG (21) TBPK tablet    Sig: Dispense one 6 day pack. Take as directed with food.    Dispense:  21 tablet    Refill:  0      *This clinic note was created using Scientist, clinical (histocompatibility and immunogenetics). Therefore, there may be occasional  mistakes despite careful proofreading.  ?    Domenick Gong, MD 10/15/20 [phone]

## 2020-11-01 DIAGNOSIS — J4520 Mild intermittent asthma, uncomplicated: Secondary | ICD-10-CM | POA: Diagnosis not present

## 2020-12-08 ENCOUNTER — Other Ambulatory Visit: Payer: Self-pay | Admitting: Internal Medicine

## 2020-12-17 DIAGNOSIS — Z20822 Contact with and (suspected) exposure to covid-19: Secondary | ICD-10-CM | POA: Diagnosis not present

## 2020-12-18 ENCOUNTER — Telehealth (INDEPENDENT_AMBULATORY_CARE_PROVIDER_SITE_OTHER): Payer: BC Managed Care – PPO | Admitting: Internal Medicine

## 2020-12-18 DIAGNOSIS — J309 Allergic rhinitis, unspecified: Secondary | ICD-10-CM

## 2020-12-18 DIAGNOSIS — J329 Chronic sinusitis, unspecified: Secondary | ICD-10-CM | POA: Diagnosis not present

## 2020-12-18 DIAGNOSIS — J4530 Mild persistent asthma, uncomplicated: Secondary | ICD-10-CM | POA: Diagnosis not present

## 2020-12-18 DIAGNOSIS — J4531 Mild persistent asthma with (acute) exacerbation: Secondary | ICD-10-CM

## 2020-12-18 MED ORDER — AMOXICILLIN-POT CLAVULANATE 875-125 MG PO TABS: 1.0000 | ORAL_TABLET | Freq: Two times a day (BID) | ORAL | 0 refills | Status: DC

## 2020-12-18 MED ORDER — PREDNISONE 10 MG (21) PO TBPK: ORAL_TABLET | ORAL | 0 refills | Status: DC

## 2020-12-18 MED ORDER — HYDROCODONE BIT-HOMATROP MBR 5-1.5 MG/5ML PO SOLN: 5.0000 mL | Freq: Four times a day (QID) | ORAL | 0 refills | Status: AC | PRN

## 2020-12-18 NOTE — Patient Instructions (Signed)
Please take all new medication as prescribed  You are given the work note  Plesae let us know if the COVID PCR testing is positive

## 2020-12-20 ENCOUNTER — Encounter: Payer: Self-pay | Admitting: Internal Medicine

## 2020-12-20 DIAGNOSIS — J329 Chronic sinusitis, unspecified: Secondary | ICD-10-CM | POA: Insufficient documentation

## 2020-12-20 NOTE — Assessment & Plan Note (Signed)
/  Mild to mod, for predpac asd,  to f/u any worsening symptoms or concerns 

## 2020-12-20 NOTE — Assessment & Plan Note (Signed)
Stable but for allergy referral per pt reqeust

## 2020-12-20 NOTE — Assessment & Plan Note (Signed)
Mild to mod, for antibx course, cough med prn, to f/u any worsening symptoms or concerns 

## 2020-12-20 NOTE — Progress Notes (Signed)
Virtual Visit via Video Note  I connected with Valerie [name] on December 18, 2020 at 10:40 AM EDT by a video enabled telemedicine application and verified that I am speaking with the correct person using two identifiers.  Location of all participants today Patient: at home Provider: at office   I discussed the limitations of evaluation and management by telemedicine and the availability of in person appointments. The patient expressed understanding and agreed to proceed.  History of Present Illness:  Here with 2-3 days acute onset fever, facial pain, pressure, headache, general weakness and malaise, and greenish d/c, with mild ST and cough, but pt denies chest pain, wheezing, increased sob or doe, orthopnea, PND, increased LE swelling, palpitations, dizziness or syncope.  Does have several wks ongoing nasal allergy symptoms with clearish congestion, itch and sneezing, without fever, pain, ST, cough, swelling or wheezing.  Pt has a covid test pending from July 27.  Needs work note Past Medical History:  Diagnosis Date   Allergic rhinitis    Anxiety    Childhood asthma    Depression 05/19/2017   Migraine    No past surgical history on file.  reports that she is a non-smoker but has been exposed to tobacco smoke. She has never used smokeless tobacco. She reports current alcohol use. She reports that she does not use drugs. family history includes AAA (abdominal aortic aneurysm) in her maternal grandfather; Alcohol abuse in her maternal grandfather; Arthritis in her maternal grandmother, mother, paternal grandfather, and paternal grandmother; Asthma in her mother; Bone cancer in her maternal grandmother; COPD in her maternal grandfather; Cancer in her maternal grandfather; Depression in her father, mother, and paternal grandmother; Heart disease in her maternal grandmother; Hyperlipidemia in her mother and paternal grandfather; Hypertension in her paternal grandmother; Stroke in her maternal  grandmother. No Known Allergies Current Outpatient Medications on File Prior to Visit  Medication Sig Dispense Refill   acetaminophen (TYLENOL) 500 MG tablet Take 500 mg by mouth every 6 (six) hours as needed.     albuterol (PROVENTIL HFA;VENTOLIN HFA) 108 (90 Base) MCG/ACT inhaler Inhale 2 puffs into the lungs every 6 (six) hours as needed for wheezing or shortness of breath. 18 g 11   Azelastine-Fluticasone 137-50 MCG/ACT SUSP Place 1 spray into the nose 2 (two) times daily as needed. 23 g 5   budesonide-formoterol (SYMBICORT) 160-4.5 MCG/ACT inhaler TAKE 2 PUFFS BY MOUTH TWICE A DAY 30.6 Inhaler 3   citalopram (CELEXA) 40 MG tablet TAKE 1 TABLET BY MOUTH EVERY DAY 30 tablet 0   montelukast (SINGULAIR) 10 MG tablet Take 10 mg by mouth at bedtime.     ondansetron (ZOFRAN ODT) 4 MG disintegrating tablet Take 1 tablet (4 mg total) by mouth every 8 (eight) hours as needed for nausea or vomiting. 20 tablet 0   rizatriptan (MAXALT) 10 MG tablet TAKE 1 TABLET BY MOUTH AS NEEDED FOR MIGRAINE. MAY REPEAT IN 2 HOURS IF NEEDED 10 tablet 0   Sod Fluoride-Potassium Nitrate (PREVIDENT 5000 SENSITIVE) 1.1-5 % PSTE PreviDent 5000 Sensitive 1.1 %-5 % dental paste  USE AS DIRECTED 3 TIMES A DAY. DO NOT SWALLOW     triamcinolone (KENALOG) 0.1 % paste Use as directed 1 application in the mouth or throat 2 (two) times daily. 5 g 12   No current facility-administered medications on file prior to visit.    Observations/Objective: Alert, NAD, appropriate mood and affect, resps normal, cn 2-12 intact, moves all 4s, no visible rash or swelling Lab Results  Component Value Date   WBC 6.4 01/23/2020   HGB 14.7 01/23/2020   HCT 42.5 01/23/2020   PLT 262 01/23/2020   GLUCOSE 82 01/23/2020   CHOL 134 05/19/2017   TRIG 99.0 05/19/2017   HDL 41.20 05/19/2017   LDLCALC 73 05/19/2017   ALT 10 01/23/2020   AST 14 01/23/2020   NA 137 01/23/2020   K 4.4 01/23/2020   CL 103 01/23/2020   CREATININE 0.88 01/23/2020    BUN 9 01/23/2020   CO2 26 01/23/2020   TSH 4.25 01/23/2020   Assessment and Plan: See notes  Follow Up Instructions: See notes   I discussed the assessment and treatment plan with the patient. The patient was provided an opportunity to ask questions and all were answered. The patient agreed with the plan and demonstrated an understanding of the instructions.   The patient was advised to call back or seek an in-person evaluation if the symptoms worsen or if the condition fails to improve as anticipated.   Oliver Barre, MD

## 2021-01-08 ENCOUNTER — Other Ambulatory Visit: Payer: Self-pay | Admitting: Internal Medicine

## 2021-01-29 DIAGNOSIS — Z20822 Contact with and (suspected) exposure to covid-19: Secondary | ICD-10-CM | POA: Diagnosis not present

## 2021-03-01 ENCOUNTER — Other Ambulatory Visit: Payer: Self-pay | Admitting: Internal Medicine

## 2021-03-01 ENCOUNTER — Ambulatory Visit
Admission: EM | Admit: 2021-03-01 | Discharge: 2021-03-01 | Disposition: A | Discharge status: Home / Self Care | Payer: BC Managed Care – PPO | Attending: Internal Medicine | Admitting: Internal Medicine

## 2021-03-01 ENCOUNTER — Other Ambulatory Visit: Payer: Self-pay

## 2021-03-01 DIAGNOSIS — H65192 Other acute nonsuppurative otitis media, left ear: Secondary | ICD-10-CM | POA: Insufficient documentation

## 2021-03-01 DIAGNOSIS — R0602 Shortness of breath: Secondary | ICD-10-CM | POA: Diagnosis not present

## 2021-03-01 DIAGNOSIS — J029 Acute pharyngitis, unspecified: Secondary | ICD-10-CM | POA: Diagnosis not present

## 2021-03-01 DIAGNOSIS — J069 Acute upper respiratory infection, unspecified: Secondary | ICD-10-CM | POA: Insufficient documentation

## 2021-03-01 DIAGNOSIS — Z20822 Contact with and (suspected) exposure to covid-19: Secondary | ICD-10-CM

## 2021-03-01 LAB — POCT RAPID STREP A (OFFICE): Rapid Strep A Screen: NEGATIVE

## 2021-03-01 MED ORDER — PROMETHAZINE-DM 6.25-15 MG/5ML PO SYRP: 5.0000 mL | ORAL_SOLUTION | Freq: Four times a day (QID) | ORAL | 0 refills | Status: DC | PRN

## 2021-03-01 MED ORDER — AMOXICILLIN-POT CLAVULANATE 875-125 MG PO TABS: 1.0000 | ORAL_TABLET | Freq: Two times a day (BID) | ORAL | 0 refills | Status: DC

## 2021-03-01 MED ORDER — PREDNISONE 20 MG PO TABS: 40.0000 mg | ORAL_TABLET | Freq: Every day | ORAL | 0 refills | Status: AC

## 2021-03-01 NOTE — ED Triage Notes (Signed)
Pt c/o coughing, sore throat, nasal congestion with drainage, nausea.  Denies headache, vomiting, diarrhea, constipation, body aches and chills.   Onset yesterday

## 2021-03-01 NOTE — Discharge Instructions (Signed)
Your rapid strep test was negative.  Throat culture and COVID-19 viral swab are pending.  We will call if they are positive.  You likely have a viral upper respiratory infection that should resolve in the next few days.  You have been prescribed Augmentin  antibiotic to treat left ear infection.  You have also been prescribed prednisone steroid to decrease inflammation in chest and cough medication to take as needed.  Please be advised that this cough medication can cause drowsiness.

## 2021-03-01 NOTE — ED Provider Notes (Signed)
[facility]    CSN: [id number] Arrival date & time: 03/01/21  0936      History   Chief Complaint Chief Complaint  Patient presents with   Cough    HPI Valerie [name] is a 20 y.o. female.   Patient presents with coughing, nasal congestion, sore throat that has been present for approximately 1 to 2 days.  Patient denies chest pain, nausea, vomiting, diarrhea, abdominal pain, any known fevers, any known sick contacts.  Patient does endorse intermittent shortness of breath but states that this is baseline for her as she has eosinophilic asthma.  Patient is followed by asthma specialist.  Has appointment in a few weeks with asthma specialist.  Patient has not yet taken any medications to help alleviate symptoms.  Patient does have albuterol inhaler as well as Symbicort inhaler at home.  Patient also reports that she has had "chronic sinus problems" for a few months.  Has not yet seen an ENT specialist.   Cough  Past Medical History:  Diagnosis Date   Allergic rhinitis    Anxiety    Childhood asthma    Depression 05/19/2017   Migraine     Patient Active Problem List   Diagnosis Date Noted   Sinusitis 12/20/2020   Neck pain 01/23/2020   Chest pain 01/23/2020   Dysuria 01/23/2020   Other fatigue 04/28/2019   Chronic sinusitis 04/28/2019   Insomnia 11/13/2018   Aphthous ulcer of mouth 11/13/2018   Deviated nasal septum 11/13/2018   Asthma 12/27/2017   Ear pain, bilateral 12/01/2017   Cough 11/16/2017   Urinary tract infection without hematuria 05/22/2017   Depression 05/19/2017   Encounter for well adult exam with abnormal findings 05/19/2017   Mild intermittent asthma    Allergic rhinitis    Migraine    Anxiety     History reviewed. No pertinent surgical history.  OB History   No obstetric history on file.      Home Medications    Prior to Admission medications   Medication Sig Start Date End Date Taking? Authorizing Provider   amoxicillin-clavulanate (AUGMENTIN) 875-125 MG tablet Take 1 tablet by mouth every 12 (twelve) hours. 03/01/21  Yes Lance Muss, FNP  predniSONE (DELTASONE) 20 MG tablet Take 2 tablets (40 mg total) by mouth daily for 5 days. 03/01/21 03/06/21 Yes Lance Muss, FNP  promethazine-dextromethorphan (PROMETHAZINE-DM) 6.25-15 MG/5ML syrup Take 5 mLs by mouth 4 (four) times daily as needed for cough. 03/01/21  Yes Lance Muss, FNP  acetaminophen (TYLENOL) 500 MG tablet Take 500 mg by mouth every 6 (six) hours as needed.    [provider]  albuterol (PROVENTIL HFA;VENTOLIN HFA) 108 (90 Base) MCG/ACT inhaler Inhale 2 puffs into the lungs every 6 (six) hours as needed for wheezing or shortness of breath. 12/27/17   Corwin Levins, MD  Azelastine-Fluticasone [phone] MCG/ACT SUSP Place 1 spray into the nose 2 (two) times daily as needed. 02/15/18   Marcelyn Bruins, MD  budesonide-formoterol [facility]) 160-4.5 MCG/ACT inhaler TAKE 2 PUFFS BY MOUTH TWICE A DAY 08/22/19   Corwin Levins, MD  citalopram (CELEXA) 40 MG tablet TAKE 1 TABLET BY MOUTH EVERY DAY 01/08/21   Corwin Levins, MD  montelukast (SINGULAIR) 10 MG tablet Take 10 mg by mouth at bedtime.    [provider]  ondansetron (ZOFRAN ODT) 4 MG disintegrating tablet Take 1 tablet (4 mg total) by mouth every 8 (eight) hours as needed for nausea or vomiting.  03/21/19   Hall-Potvin, [place], PA-C  rizatriptan (MAXALT) 10 MG tablet TAKE 1 TABLET BY MOUTH AS NEEDED FOR MIGRAINE. MAY REPEAT IN 2 HOURS IF NEEDED 12/08/20   Corwin Levins, MD  Sod Fluoride-Potassium Nitrate (PREVIDENT 5000 SENSITIVE) 1.1-5 % PSTE PreviDent 5000 Sensitive 1.1 %-5 % dental paste  USE AS DIRECTED 3 TIMES A DAY. DO NOT SWALLOW    [provider]  triamcinolone (KENALOG) 0.1 % paste Use as directed 1 application in the mouth or throat 2 (two) times daily. 11/13/18   Corwin Levins, MD    Family History Family History  Problem Relation Age of  Onset   Arthritis Mother    Asthma Mother    Depression Mother    Hyperlipidemia Mother    Depression Father    Arthritis Maternal Grandmother    Bone cancer Maternal Grandmother    Heart disease Maternal Grandmother    Stroke Maternal Grandmother    Alcohol abuse Maternal Grandfather    COPD Maternal Grandfather    Cancer Maternal Grandfather    AAA (abdominal aortic aneurysm) Maternal Grandfather    Arthritis Paternal Grandmother    Depression Paternal Grandmother    Hypertension Paternal Grandmother    Arthritis Paternal Grandfather    Hyperlipidemia Paternal Grandfather     Social History Social History   Tobacco Use   Smoking status: Passive Smoke Exposure - Never Smoker   Smokeless tobacco: Never   Tobacco comments:    mom smokes both inside and outside  Vaping Use   Vaping Use: Former   Devices: 3 years ago, for about 2 months and then she quit  Substance Use Topics   Alcohol use: Yes    Comment: 0cc   Drug use: No     Allergies   Patient has no known allergies.   Review of Systems Review of Systems Per HPI  Physical Exam Triage Vital Signs ED Triage Vitals  Enc Vitals Group     BP 03/01/21 1048 121/80     Pulse Rate 03/01/21 1048 96     Resp 03/01/21 1048 18     Temp 03/01/21 1048 97.9 F (36.6 C)     Temp Source 03/01/21 1048 Oral     SpO2 03/01/21 1048 97 %     Weight --      Height --      Head Circumference --      Peak Flow --      Pain Score 03/01/21 1049 0     Pain Loc --      Pain Edu? --      Excl. in GC? --    No data found.  Updated Vital Signs BP 121/80 (BP Location: Left Arm)   Pulse 96   Temp 97.9 F (36.6 C) (Oral)   Resp 18   SpO2 97%   Visual Acuity Right Eye Distance:   Left Eye Distance:   Bilateral Distance:    Right Eye Near:   Left Eye Near:    Bilateral Near:     Physical Exam Constitutional:      General: She is not in acute distress.    Appearance: Normal appearance. She is not toxic-appearing or  diaphoretic.  HENT:     Head: Normocephalic and atraumatic.     Right Ear: Ear canal normal. Tympanic membrane is erythematous. Tympanic membrane is not bulging.     Left Ear: Ear canal normal. A middle ear effusion is present. Tympanic membrane is not erythematous  or bulging.     Nose: Congestion present.     Mouth/Throat:     Mouth: Mucous membranes are moist.     Pharynx: Posterior oropharyngeal erythema present.  Eyes:     Extraocular Movements: Extraocular movements intact.     Conjunctiva/sclera: Conjunctivae normal.     Pupils: Pupils are equal, round, and reactive to light.  Cardiovascular:     Rate and Rhythm: Normal rate and regular rhythm.     Pulses: Normal pulses.     Heart sounds: Normal heart sounds.  Pulmonary:     Effort: Pulmonary effort is normal. No respiratory distress.     Breath sounds: Normal breath sounds. No stridor. No wheezing or rhonchi.     Comments: Harsh cough on exam. Abdominal:     General: Abdomen is flat. Bowel sounds are normal.     Palpations: Abdomen is soft.  Musculoskeletal:        General: Normal range of motion.     Cervical back: Normal range of motion.  Skin:    General: Skin is warm and dry.  Neurological:     General: No focal deficit present.     Mental Status: She is alert and oriented to person, place, and time. Mental status is at baseline.  Psychiatric:        Mood and Affect: Mood normal.        Behavior: Behavior normal.     UC Treatments / Results  Labs (all labs ordered are listed, but only abnormal results are displayed) Labs Reviewed  CULTURE, GROUP A STREP ([facility])  NOVEL CORONAVIRUS, NAA  POCT RAPID STREP A (OFFICE)    EKG   Radiology No results found.  Procedures Procedures (including critical care time)  Medications Ordered in UC Medications - No data to display  Initial Impression / Assessment and Plan / UC Course  I have reviewed the triage vital signs and the nursing notes.  Pertinent labs &  imaging results that were available during my care of the patient were reviewed by me and considered in my medical decision making (see chart for details).     Patient presents with symptoms likely from a viral upper respiratory infection. Differential includes bacterial pneumonia, sinusitis, allergic rhinitis, Covid 19. Do not suspect underlying cardiopulmonary process. Symptoms seem unlikely related to ACS, CHF or COPD exacerbations, pneumonia, pneumothorax. Patient is nontoxic appearing and not in need of emergent medical intervention.  Rapid strep test was negative.  Throat culture and COVID-19 viral swab are pending.  Patient is high risk for asthma exacerbation.  Will treat with prednisone steroid x5 days.  Also treating with Augmentin antibiotic due to left ear infection.  Promethazine DM to take as needed for harsh cough.  Advised patient that this medication can cause drowsiness.  Recommended symptom control with over the counter medications: Daily oral anti-histamine, Oral decongestant or IN corticosteroid, saline irrigations, cepacol lozenges, honey tea.  Return if symptoms fail to improve in 1-2 weeks or you develop shortness of breath, chest pain, severe headache. Patient states understanding and is agreeable.  Discharged with PCP followup.  Final Clinical Impressions(s) / UC Diagnoses   Final diagnoses:  Sore throat  Encounter for laboratory testing for COVID-19 virus  Viral upper respiratory tract infection with cough  Shortness of breath  Other non-recurrent acute nonsuppurative otitis media of left ear     Discharge Instructions      Your rapid strep test was negative.  Throat culture and COVID-19 viral swab  are pending.  We will call if they are positive.  You likely have a viral upper respiratory infection that should resolve in the next few days.  You have been prescribed Augmentin  antibiotic to treat left ear infection.  You have also been prescribed prednisone steroid  to decrease inflammation in chest and cough medication to take as needed.  Please be advised that this cough medication can cause drowsiness.     ED Prescriptions     Medication Sig Dispense Auth. Provider   amoxicillin-clavulanate (AUGMENTIN) 875-125 MG tablet Take 1 tablet by mouth every 12 (twelve) hours. 14 tablet Henrene Dodge E, FNP   predniSONE (DELTASONE) 20 MG tablet Take 2 tablets (40 mg total) by mouth daily for 5 days. 10 tablet Lance Muss, FNP   promethazine-dextromethorphan (PROMETHAZINE-DM) 6.25-15 MG/5ML syrup Take 5 mLs by mouth 4 (four) times daily as needed for cough. 118 mL Lance Muss, FNP      PDMP not reviewed this encounter.   Lance Muss, FNP 03/01/21 [phone]

## 2021-03-02 LAB — NOVEL CORONAVIRUS, NAA: SARS-CoV-2, NAA: NOT DETECTED

## 2021-03-02 LAB — SARS-COV-2, NAA 2 DAY TAT

## 2021-03-04 LAB — CULTURE, GROUP A STREP (THRC)

## 2021-03-11 ENCOUNTER — Other Ambulatory Visit: Payer: Self-pay

## 2021-03-11 ENCOUNTER — Ambulatory Visit: Payer: BC Managed Care – PPO | Admitting: Allergy

## 2021-03-11 ENCOUNTER — Encounter: Payer: Self-pay | Admitting: Allergy

## 2021-03-11 VITALS — BP 114/76 | HR 93 | Temp 98.0°F | Resp 18 | Ht 64.0 in | Wt 149.4 lb

## 2021-03-11 DIAGNOSIS — J310 Chronic rhinitis: Secondary | ICD-10-CM

## 2021-03-11 DIAGNOSIS — J0100 Acute maxillary sinusitis, unspecified: Secondary | ICD-10-CM

## 2021-03-11 DIAGNOSIS — J4540 Moderate persistent asthma, uncomplicated: Secondary | ICD-10-CM

## 2021-03-11 MED ORDER — RYALTRIS 665-25 MCG/ACT NA SUSP: 2.0000 | Freq: Two times a day (BID) | NASAL | 5 refills | Status: DC

## 2021-03-11 MED ORDER — AMOXICILLIN-POT CLAVULANATE 875-125 MG PO TABS: ORAL_TABLET | ORAL | 0 refills | Status: DC

## 2021-03-11 MED ORDER — BREZTRI AEROSPHERE 160-9-4.8 MCG/ACT IN AERO: 2.0000 | INHALATION_SPRAY | Freq: Two times a day (BID) | RESPIRATORY_TRACT | 5 refills | Status: DC

## 2021-03-11 NOTE — Progress Notes (Signed)
Follow-up Note  RE: Valerie [name]. Valerie [name] MRN: [id number] DOB: [birth year] Date of Office Visit: 03/11/2021   History of present illness: Valerie [name] is a 20 y.o. female presenting today for follow-up of asthma, nonallergic rhinitis.  She was last seen in the office on 03/06/2020 by myself.  Her asthma has not been controlled.  She states her asthma has been acting up and she states she is 'struggling'.  She has had 3 ED visits and several UC visits for her asthma over the past year.  Most recent was 2 weeks ago where she state she was diagnosed with ear infection and bronchitis.  She has completed amoxicillin and prednisone course.  She feels a bit better but states that the mucus production and congestion is still a lot.  She is needing to use her albuterol often more than twice a week on average.  She states she is taking Symbicort 2 puffs twice a day.  She was taking singulair but states was having issues with her depression thus was advised to stop her singulair.  She does feel like her depression is improved but she has good and bad days.  She was to be on Nucala after the last visit.  However she states she had "something with my insurance" in regards to getting Nucala.  After her last visit our biologics coordinator set up [employer] to get a $0 copay card. She states she signed something online from the [employer] company but did not hear anything back.  Thus she has not had Nucala in the past year.  She takes cetirizine daily which she states with her allergy symptom control.  She does use generic Dymista which is effective for nasal congestion and drainage control.  However she does not like the taste of this spray.  Review of systems: Review of Systems  Constitutional: Negative.   HENT:  Positive for congestion, ear pain and sinus pain.   Eyes: Negative.   Respiratory:  Positive for cough, sputum production, shortness of breath and wheezing.   Cardiovascular: Negative.    Gastrointestinal: Negative.   Musculoskeletal: Negative.   Skin: Negative.   Neurological: Negative.    All other systems negative unless noted above in HPI  Past medical/social/surgical/family history have been reviewed and are unchanged unless specifically indicated below.  No changes  Medication List: Current Outpatient Medications  Medication Sig Dispense Refill   acetaminophen (TYLENOL) 500 MG tablet Take 500 mg by mouth every 6 (six) hours as needed.     albuterol (PROVENTIL HFA;VENTOLIN HFA) 108 (90 Base) MCG/ACT inhaler Inhale 2 puffs into the lungs every 6 (six) hours as needed for wheezing or shortness of breath. 18 g 11   amoxicillin-clavulanate (AUGMENTIN) 875-125 MG tablet Take one tablet twice a day for 10 days 20 tablet 0   Azelastine-Fluticasone 137-50 MCG/ACT SUSP Place 1 spray into the nose 2 (two) times daily as needed. 23 g 5   Budeson-Glycopyrrol-Formoterol (BREZTRI AEROSPHERE) 160-9-4.8 MCG/ACT AERO Inhale 2 puffs into the lungs in the morning and at bedtime. 10.7 g 5   citalopram (CELEXA) 40 MG tablet TAKE 1 TABLET BY MOUTH EVERY DAY 90 tablet 1   Olopatadine-Mometasone (RYALTRIS) 665-25 MCG/ACT SUSP Place 2 sprays into the nose 2 (two) times daily. 29 g 5   ondansetron (ZOFRAN ODT) 4 MG disintegrating tablet Take 1 tablet (4 mg total) by mouth every 8 (eight) hours as needed for nausea or vomiting. 20 tablet 0   promethazine-dextromethorphan (PROMETHAZINE-DM) 6.25-15 MG/5ML syrup Take  5 mLs by mouth 4 (four) times daily as needed for cough. 118 mL 0   rizatriptan (MAXALT) 10 MG tablet TAKE 1 TABLET BY MOUTH AS NEEDED FOR MIGRAINE. MAY REPEAT IN 2 HOURS IF NEEDED 10 tablet 0   Sod Fluoride-Potassium Nitrate 1.1-5 % PSTE PreviDent 5000 Sensitive 1.1 %-5 % dental paste  USE AS DIRECTED 3 TIMES A DAY. DO NOT SWALLOW     triamcinolone (KENALOG) 0.1 % paste Use as directed 1 application in the mouth or throat 2 (two) times daily. 5 g 12   amoxicillin-clavulanate  (AUGMENTIN) 875-125 MG tablet Take 1 tablet by mouth every 12 (twelve) hours. (Patient not taking: Reported on 03/11/2021) 14 tablet 0   montelukast (SINGULAIR) 10 MG tablet Take 10 mg by mouth at bedtime. (Patient not taking: Reported on 03/11/2021)     No current facility-administered medications for this visit.     Known medication allergies: No Known Allergies   Physical examination: Blood pressure 114/76, pulse 93, temperature 98 F (36.7 C), resp. rate 18, height 5\' 4"  (1.626 m), weight 149 lb 6.4 oz (67.8 kg), SpO2 98 %.  General: Alert, interactive, in no acute distress. HEENT: PERRLA, TMs pearly gray, turbinates moderately edematous without discharge, post-pharynx non erythematous. Neck: Supple without lymphadenopathy. Lungs: Decreased breath sounds bilaterally without wheezing, rhonchi or rales. {no increased work of breathing. CV: Normal S1, S2 without murmurs. Abdomen: Nondistended, nontender. Skin: Warm and dry, without lesions or rashes. Extremities:  No clubbing, cyanosis or edema. Neuro:   Grossly intact.  Diagnositics/Labs:  Spirometry: FEV1: 2.9 L 88%, FVC: 3.47 L 91%, ratio consistent with nonobstructive pattern  Assessment and plan:   Asthma, not well controlled Acute sinusitis   - currently with continued symptoms from acute sinusitis and will extend your antibiotic course with Augmentin 875mg  1 tab twice a day x 10 days   - stop Symbicort.  Step-up to Breztri 2 puffs twice a day.  This is a triple therapy inhaler.  Rinse mouth after use.   - CBC w diff obtained today to submit reapproval for Nucala.  Tammy should be in contact with you in regards to approval.  Please contact in next month if you have not gotten your medication or received any communication from specialty pharmacy.     Asthma control goals:  Full participation in all desired activities (may need albuterol before activity) Albuterol use two time or less a week on average (not counting use  with activity) Cough interfering with sleep two time or less a month Oral steroids no more than once a year No hospitalizations  Rhinitis, nonallergic   - environmental allergy testing has been negative by skin testing and serum IgE testing   - continue taking daily antihistamine like Cetirizine 10mg  daily    - for nasal congestion/drainage try Ryaltris 2 sprays each nostril twice a day as needed.  Ryaltris is a combination nasal spray like Azelastine-Fluticasone you have but is suppose to have a better taste and work faster.  Will send to specialty pharmacy.      Follow-up 2-3 months or sooner if needed  I appreciate the opportunity to take part in [place] care. Please do not hesitate to contact me with questions.  Sincerely,   [place], MD Allergy/Immunology [facility]

## 2021-03-11 NOTE — Patient Instructions (Addendum)
Asthma, not well controlled   - currently with continued symptoms from acute sinusitis and will extend your antibiotic course with Augmentin 875mg  1 tab twice a day x 10 days   - stop Symbicort.  Step-up to Breztri 2 puffs twice a day.  This is a triple therapy inhaler.  Rinse mouth after use.   - CBC w diff obtained today to submit reapproval for Nucala.  Tammy should be in contact with you in regards to approval.  Please contact in next month if you have not gotten your medication or received any communication from specialty pharmacy.     Asthma control goals:  Full participation in all desired activities (may need albuterol before activity) Albuterol use two time or less a week on average (not counting use with activity) Cough interfering with sleep two time or less a month Oral steroids no more than once a year No hospitalizations  Rhinitis, nonallergic   - environmental allergy testing has been negative by skin testing and serum IgE testing   - continue taking daily antihistamine like Cetirizine 10mg  daily    - for nasal congestion/drainage try Ryaltris 2 sprays each nostril twice a day as needed.  Ryaltris is a combination nasal spray like Azelastine-Fluticasone you have but is suppose to have a better taste and work faster.  Will send to specialty pharmacy.      Follow-up 2-3 months or sooner if needed

## 2021-03-12 LAB — CBC WITH DIFFERENTIAL
Basophils Absolute: 0.1 10*3/uL (ref 0.0–0.2)
Basos: 1 %
EOS (ABSOLUTE): 0.6 10*3/uL — ABNORMAL HIGH (ref 0.0–0.4)
Eos: 10 %
Hematocrit: 41 % (ref 34.0–46.6)
Hemoglobin: 14.1 g/dL (ref 11.1–15.9)
Immature Grans (Abs): 0 10*3/uL (ref 0.0–0.1)
Immature Granulocytes: 0 %
Lymphocytes Absolute: 2.5 10*3/uL (ref 0.7–3.1)
Lymphs: 38 %
MCH: 30.3 pg (ref 26.6–33.0)
MCHC: 34.4 g/dL (ref 31.5–35.7)
MCV: 88 fL (ref 79–97)
Monocytes Absolute: 0.5 10*3/uL (ref 0.1–0.9)
Monocytes: 8 %
Neutrophils Absolute: 2.9 10*3/uL (ref 1.4–7.0)
Neutrophils: 43 %
RBC: 4.66 x10E6/uL (ref 3.77–5.28)
RDW: 11.9 % (ref 11.7–15.4)
WBC: 6.5 10*3/uL (ref 3.4–10.8)

## 2021-03-13 ENCOUNTER — Other Ambulatory Visit: Payer: Self-pay | Admitting: Internal Medicine

## 2021-03-31 ENCOUNTER — Telehealth: Payer: Self-pay | Admitting: *Deleted

## 2021-03-31 NOTE — Telephone Encounter (Signed)
L/M for patient to contact me to advised approval and resubmit for [employer]

## 2021-04-08 NOTE — Telephone Encounter (Signed)
L/m for patient to contact me again ?

## 2021-04-26 ENCOUNTER — Other Ambulatory Visit: Payer: Self-pay | Admitting: Internal Medicine

## 2021-05-04 NOTE — Telephone Encounter (Signed)
Patient never returned calls to me °

## 2021-05-21 ENCOUNTER — Telehealth: Payer: Self-pay | Admitting: *Deleted

## 2021-05-21 MED ORDER — NUCALA 100 MG/ML ~~LOC~~ SOAJ: 100.0000 mg | SUBCUTANEOUS | 11 refills | Status: DC

## 2021-05-21 NOTE — Telephone Encounter (Signed)
Patient called and I advised her I had l/m for her to contact me regarding her Nucala Aj will go ahead and send rx and advised patient to contact copay card program to get info to make sure she has info for $0 copay for her Nucala

## 2021-06-11 ENCOUNTER — Telehealth: Payer: Self-pay | Admitting: *Deleted

## 2021-06-11 NOTE — Telephone Encounter (Signed)
Patient had reached out to me and I called and left number for her to call me back

## 2021-06-25 NOTE — Telephone Encounter (Signed)
Patient never called me back. If she wants to reach out she can contact me. In the past it has been hard to reach patient

## 2021-06-29 ENCOUNTER — Other Ambulatory Visit: Payer: Self-pay | Admitting: Internal Medicine

## 2021-07-01 ENCOUNTER — Ambulatory Visit: Payer: BC Managed Care – PPO | Admitting: Allergy

## 2021-07-10 DIAGNOSIS — J069 Acute upper respiratory infection, unspecified: Secondary | ICD-10-CM | POA: Diagnosis not present

## 2021-07-24 NOTE — Patient Instructions (Addendum)
Asthma ?-Start prednisone 10 mg taking 2 tablets twice a day for 3 days, then on the fourth day take 2 tablets in the morning, then on the fifth day take 1 tablet and stop ?I-f your breathing does not get better I recommend you going on to the emergency room or urgent care ?  -Continue Breztri 2 puffs twice a day with spacer to help prevent cough and wheeze.  Rinse mouth after use.  We will send in a prescription for a spacer ?  -We will send a message to Tammy, our Biologics coordinator, to find out where you are in the process of getting your Nucala approved ?  ?Asthma control goals:  ?Full participation in all desired activities (may need albuterol before activity) ?Albuterol use two time or less a week on average (not counting use with activity) ?Cough interfering with sleep two time or less a month ?Oral steroids no more than once a year ?No hospitalizations ? ?Rhinitis, nonallergic ?  - environmental allergy testing has been negative by skin testing and serum IgE testing ?  - continue taking daily antihistamine like Cetirizine 10mg  daily  ?  - for nasal congestion/drainage continue Ryaltris 2 sprays each nostril twice a day as needed.  ? ?Recurrent sinus infections ?-Get sinus ct without contrast due to frequent sinus infections and not being able to smell ?- We will get lab work due to your frequent sinus infections.  We will call you with results once they are all back ? ? ?Follow-up in 4-6 weeks with Dr. or sooner if needed ?

## 2021-07-27 ENCOUNTER — Encounter: Payer: Self-pay | Admitting: Family

## 2021-07-27 ENCOUNTER — Ambulatory Visit (INDEPENDENT_AMBULATORY_CARE_PROVIDER_SITE_OTHER): Payer: BC Managed Care – PPO | Admitting: Family

## 2021-07-27 ENCOUNTER — Telehealth: Payer: Self-pay

## 2021-07-27 ENCOUNTER — Other Ambulatory Visit: Payer: Self-pay

## 2021-07-27 DIAGNOSIS — J4541 Moderate persistent asthma with (acute) exacerbation: Secondary | ICD-10-CM | POA: Diagnosis not present

## 2021-07-27 DIAGNOSIS — J329 Chronic sinusitis, unspecified: Secondary | ICD-10-CM | POA: Diagnosis not present

## 2021-07-27 DIAGNOSIS — R430 Anosmia: Secondary | ICD-10-CM | POA: Diagnosis not present

## 2021-07-27 DIAGNOSIS — J310 Chronic rhinitis: Secondary | ICD-10-CM

## 2021-07-27 MED ORDER — NUCALA 100 MG/ML ~~LOC~~ SOAJ: 100.0000 mg | SUBCUTANEOUS | 11 refills | Status: DC

## 2021-07-27 MED ORDER — PREDNISONE 10 MG PO TABS
ORAL_TABLET | ORAL | 0 refills | Status: DC
Start: 2021-07-27 — End: 2021-10-11

## 2021-07-27 MED ORDER — SPACER/AERO-HOLDING CHAMBERS DEVI: 0 refills | Status: AC

## 2021-07-27 MED ORDER — NUCALA 100 MG/ML ~~LOC~~ SOAJ
100.0000 mg | SUBCUTANEOUS | 11 refills | Status: DC
Start: 2021-07-27 — End: 2021-07-27

## 2021-07-27 NOTE — Progress Notes (Signed)
RE: Syndee Zieman. Laymance MRN: [id number] DOB: January 24, 2001 Date of Telemedicine Visit: 07/27/2021  Referring provider: Corwin Levins, MD Primary care provider: Corwin Levins, MD  Chief Complaint: Sinusitis (Constant sinus infections. Been given steroids but it just bounces back (1 year time frame) Hasn't been able to smell in about 2 years. ), Other (Having trouble breathing while eating and talking. Has to stop eating and put food to one side of her mouth so that she can mouth breath. Also having breathing issues while sleeping. Dreams will wake her up like she is drowning to notify her she needs to wake up to breathe.), Allergic Rhinitis , and Asthma   Telemedicine Follow Up Visit via Telephone: I connected with Heater Osuch for a follow up on 07/27/21 by telephone and verified that I am speaking with the correct person using two identifiers.   I discussed the limitations, risks, security and privacy concerns of performing an evaluation and management service by telephone and the availability of in person appointments. I also discussed with the patient that there may be a patient responsible charge related to this service. The patient expressed understanding and agreed to proceed.  Patient is at home  Provider is at the office.  Visit start time: 2:08 PM Visit end time: 2:48 PM Insurance consent/check in by: Victorino Dike T. Medical consent and medical assistant/nurse: Terence Lux.  History of Present Illness: She is a 21 y.o. female, who is being followed for not well controlled asthma, acute sinusitis, and nonallergic rhinitis. Her previous allergy office visit was on March 11, 2021 with Dr. Delorse Lek.  She is currently on clindamycin for a tooth infection that needs a root canal.  She is scheduled to have this tomorrow but will probably have to cancel due to not being able to afford the cost.  Asthma is reported as not well controlled with Breztri 2 puffs twice a day and albuterol as needed.   She reports a cough that is primarily at night to the point where she cannot get rest.  The cough is reported as mostly dry.  She reports a lot of wheezing yesterday, occasional tightness in her chest, shortness of breath all the time, and nocturnal awakenings due to breathing problems.  She reports that when she is talking she will have to stop to catch her breath.  Also, when she chews she will have to move her food over.  Since her last office visit she has not required any trips to the emergency room or urgent care due to breathing problems because she reports that they cannot do anything for her.  She also has been on 1 round of steroids back in December or January.  She uses her albuterol every day or so.  She has not started Nucala injections but is interested in starting.  She did the co-pay program and got approved.  She reports she gave that information to the pharmacy and was told that they did not have a prescription.  She was told that the prescription was from 2 years ago.  She reports that 6 days ago she finished amoxicillin that was given by urgent care due to an upper respiratory infection and vertigo.  She reports since taking the amoxicillin she is not having as much tightness or the vertigo.  Nonallergic rhinitis is reported as not well controlled with cetirizine 10 mg once a day and Ryaltris spray.  She reports that she has had a lot of sinus infections.  When asked how  many she reports 2 sinus infections or 1 the entire time.  She is not able to smell.  She reports rhinorrhea that can be green in color.  She reports nasal congestion and postnasal drip that causes a sore throat.  Assessment and Plan: Thanya is a 21 y.o. female with: Patient Instructions  Asthma -Start prednisone 10 mg taking 2 tablets twice a day for 3 days, then on the fourth day take 2 tablets in the morning, then on the fifth day take 1 tablet and stop I-f your breathing does not get better I recommend you going on to  the emergency room or urgent care   -Continue Breztri 2 puffs twice a day with spacer to help prevent cough and wheeze.  Rinse mouth after use.  We will send in a prescription for a spacer   -We will send a message to Tammy, our Biologics coordinator, to find out where you are in the process of getting your Nucala approved   Asthma control goals:  Full participation in all desired activities (may need albuterol before activity) Albuterol use two time or less a week on average (not counting use with activity) Cough interfering with sleep two time or less a month Oral steroids no more than once a year No hospitalizations  Rhinitis, nonallergic   - environmental allergy testing has been negative by skin testing and serum IgE testing   - continue taking daily antihistamine like Cetirizine 10mg  daily    - for nasal congestion/drainage continue Ryaltris 2 sprays each nostril twice a day as needed.   Recurrent sinus infections -Get sinus ct without contrast due to frequent sinus infections and not being able to smell - We will get lab work due to your frequent sinus infections.  We will call you with results once they are all back   Follow-up in 4-6 weeks with Dr. or sooner if needed  Return in about 4 weeks (around 08/24/2021), or if symptoms worsen or fail to improve.  Meds ordered this encounter  Medications   Spacer/Aero-Holding 10/24/2021 DEVI    Sig: Use as directed with inhaler.    Dispense:  1 each    Refill:  0   predniSONE (DELTASONE) 10 MG tablet    Sig: Day 1-3: 2 tablets twice a day, Day 4: Take 2 tablets at breakfast, Day 5: 1 tablet at breakfast and then stop    Dispense:  15 tablet    Refill:  0   Lab Orders         CBC With Differential         Comprehensive metabolic panel         Diphtheria / Tetanus Antibody Panel         Strep pneumoniae 23 Serotypes IgG         IgG, IgA, IgM         Complement, total      Diagnostics: None.  Medication List:  Current  Outpatient Medications  Medication Sig Dispense Refill   albuterol (PROVENTIL HFA;VENTOLIN HFA) 108 (90 Base) MCG/ACT inhaler Inhale 2 puffs into the lungs every 6 (six) hours as needed for wheezing or shortness of breath. 18 g 11   Azelastine-Fluticasone 137-50 MCG/ACT SUSP Place 1 spray into the nose 2 (two) times daily as needed. 23 g 5   Budeson-Glycopyrrol-Formoterol (BREZTRI AEROSPHERE) 160-9-4.8 MCG/ACT AERO Inhale 2 puffs into the lungs in the morning and at bedtime. 10.7 g 5   citalopram (CELEXA) 40 MG tablet TAKE  1 TABLET BY MOUTH EVERY DAY 90 tablet 1   Olopatadine-Mometasone (RYALTRIS) 665-25 MCG/ACT SUSP Place 2 sprays into the nose 2 (two) times daily. 29 g 5   predniSONE (DELTASONE) 10 MG tablet Day 1-3: 2 tablets twice a day, Day 4: Take 2 tablets at breakfast, Day 5: 1 tablet at breakfast and then stop 15 tablet 0   rizatriptan (MAXALT) 10 MG tablet TAKE 1 TABLET BY MOUTH AS NEEDED FOR MIGRAINE. MAY REPEAT IN 2 HOURS IF NEEDED 8 tablet 1   Sod Fluoride-Potassium Nitrate 1.1-5 % PSTE PreviDent 5000 Sensitive 1.1 %-5 % dental paste  USE AS DIRECTED 3 TIMES A DAY. DO NOT SWALLOW     Spacer/Aero-Holding Rudean Curt Use as directed with inhaler. 1 each 0   triamcinolone (KENALOG) 0.1 % paste Use as directed 1 application in the mouth or throat 2 (two) times daily. 5 g 12   acetaminophen (TYLENOL) 500 MG tablet Take 500 mg by mouth every 6 (six) hours as needed. (Patient not taking: Reported on 07/27/2021)     clindamycin (CLEOCIN) 300 MG capsule Take 300 mg by mouth 2 (two) times daily.     HYDROcodone-acetaminophen (NORCO) 7.5-325 MG tablet Take 1 tablet by mouth every 6 (six) hours as needed.     Mepolizumab (NUCALA) 100 MG/ML SOAJ Inject 1 mL (100 mg total) into the skin every 28 (twenty-eight) days. 1 mL 11   montelukast (SINGULAIR) 10 MG tablet Take 10 mg by mouth at bedtime. (Patient not taking: Reported on 03/11/2021)     ondansetron (ZOFRAN ODT) 4 MG disintegrating tablet Take 1  tablet (4 mg total) by mouth every 8 (eight) hours as needed for nausea or vomiting. (Patient not taking: Reported on 07/27/2021) 20 tablet 0   promethazine-dextromethorphan (PROMETHAZINE-DM) 6.25-15 MG/5ML syrup Take 5 mLs by mouth 4 (four) times daily as needed for cough. (Patient not taking: Reported on 07/27/2021) 118 mL 0   No current facility-administered medications for this visit.   Allergies: No Known Allergies I reviewed her past medical history, social history, family history, and environmental history and no significant changes have been reported from previous visit on March 11, 2021.  Review of Systems  Constitutional:  Negative for fever.       Denies fever but reports that some days she is hot and some days she is cold.  HENT:         Reports nasal congestion, rhinorrhea that is green, and postnasal drip that is causing a sore throat.  Eyes:        Denies itchy watery eyes  Respiratory:         Reports cough that is primarily worse at night to where she cannot get test.  The cough is mostly dry.  She reports a lot of wheezing yesterday, occasional tightness in her chest, shortness of breath all the time, and nocturnal awakenings due to cough.  Cardiovascular:  Positive for chest pain.       Reports chest pain at times.  Instructed her to go to the emergency room if this occurs again.  Genitourinary:  Positive for frequency.       Reports that she thinks that she has a urinary tract infection right now.  She has been drinking cranberry juice and has an appointment with her primary care physician on Wednesday.  Skin:        Denies rashes or itchy skin  Allergic/Immunologic: Negative for environmental allergies.  Neurological:  Positive for headaches.  Reports history of migraines   Objective: Physical Exam Not obtained as encounter was done via telephone.   Previous notes and tests were reviewed.  I discussed the assessment and treatment plan with the patient. The  patient was provided an opportunity to ask questions and all were answered. The patient agreed with the plan and demonstrated an understanding of the instructions.   The patient was advised to call back or seek an in-person evaluation if the symptoms worsen or if the condition fails to improve as anticipated.  I provided 40 minutes of non-face-to-face time during this encounter.  It was my pleasure to participate in [facility] care today. Please feel free to contact me with any questions or concerns.   Sincerely,  Nehemiah Settlehristine Haneefah Venturini, FNP

## 2021-07-27 NOTE — Telephone Encounter (Signed)
Spoke to patient and advised rx resent and check with pharmacy in 24 hours for status. I had left several messages for patient in past with no response so I advised her of any issues reach out to me ?

## 2021-07-27 NOTE — Addendum Note (Signed)
Addended by: Devoria Glassing on: 07/27/2021 03:05 PM ? ? Modules accepted: Orders ? ?

## 2021-07-27 NOTE — Telephone Encounter (Signed)
Called patient's insurance for PA for CT. Insurance said that it would be $283 for patient's responsibility. Called patient to let her know where imaging was scheduled [facility] [address])and how much it would be. Patient stated that the insurance company called her to inform her that another facility would be cheaper for this procedure. Patient is going to wait until her insurance calls her back with the details before she gets the procedure done. Advised patient that the CT order is good from today 07/27/21 until 08/25/21.  ?

## 2021-07-27 NOTE — Telephone Encounter (Signed)
Called patient and advised that rx resent to [facility] and advised patient to check with pharmacy in 24 hours to check status and arrange delivery ?

## 2021-07-27 NOTE — Telephone Encounter (Signed)
Patient had a telephone visit today with Nehemiah Settle. During the work up, patient stated that she has not been able to get the Nucala medication. The prescription was sent in on 05/21/21 but the patient states that she has been sent to multiple departments with different requirements to get her medication. The last thing she was told was that the pharmacy did not receive a prescription for this medication.   ?

## 2021-07-27 NOTE — Telephone Encounter (Signed)
Thank you. Tell her to please let us know where she would like the order for sinus CT without contrast.

## 2021-07-28 ENCOUNTER — Telehealth: Payer: BC Managed Care – PPO | Admitting: Internal Medicine

## 2021-07-28 NOTE — Telephone Encounter (Signed)
[employer] called and stated that the CT scam needed to be sent to [facility] to have her scan completed. I have sen the orders over to [facility] via fax. The number provided  by [employer] was [phone] for the phone number and fax number provided by [employer] was [phone]. I have sent the orders over via fax.   ?

## 2021-07-29 ENCOUNTER — Telehealth: Payer: BC Managed Care – PPO | Admitting: Internal Medicine

## 2021-07-29 ENCOUNTER — Telehealth: Payer: Self-pay | Admitting: Internal Medicine

## 2021-07-29 DIAGNOSIS — R059 Cough, unspecified: Secondary | ICD-10-CM

## 2021-07-29 NOTE — Telephone Encounter (Signed)
Pt call to reschedule 3-8 appt, pt states she missed the appt due to her "passing out" ? ?Asked pt if passing out meant she feel asleep or fainted, pt replied she fainted, asked pt numerous of times if I could get her additional help, pt declined, stating she was "fine" ? ?Pts next ov is 3-15 ?

## 2021-07-29 NOTE — Progress Notes (Signed)
Patient ID: Valerie [name], female   DOB: Dec 01, 2000, 21 y.o.   MRN: [id number] ? ?Pt not seen as not able to contacted by VV or phone after multiple attempts at 250pm ?

## 2021-07-29 NOTE — Telephone Encounter (Signed)
Unable to reach patient or leave a voicemail. Patient should go to the ED for syncope ?

## 2021-07-29 NOTE — Progress Notes (Signed)
Spoke to patient and resent Erx for same and advised her to reach out to me if she has any problems. I had reached out to patient multiple times in the past with no response ?

## 2021-08-02 ENCOUNTER — Encounter: Payer: Self-pay | Admitting: Internal Medicine

## 2021-08-02 NOTE — Assessment & Plan Note (Signed)
Pt not seen.

## 2021-08-02 NOTE — Patient Instructions (Signed)
Pt not seen today

## 2021-08-05 ENCOUNTER — Ambulatory Visit (INDEPENDENT_AMBULATORY_CARE_PROVIDER_SITE_OTHER): Payer: BC Managed Care – PPO | Admitting: Internal Medicine

## 2021-08-05 ENCOUNTER — Other Ambulatory Visit: Payer: Self-pay

## 2021-08-05 ENCOUNTER — Encounter: Payer: Self-pay | Admitting: Internal Medicine

## 2021-08-05 VITALS — BP 118/60 | HR 88 | Resp 18 | Ht 64.0 in | Wt 147.2 lb

## 2021-08-05 DIAGNOSIS — F419 Anxiety disorder, unspecified: Secondary | ICD-10-CM

## 2021-08-05 DIAGNOSIS — J309 Allergic rhinitis, unspecified: Secondary | ICD-10-CM

## 2021-08-05 DIAGNOSIS — J4530 Mild persistent asthma, uncomplicated: Secondary | ICD-10-CM | POA: Diagnosis not present

## 2021-08-05 MED ORDER — HYDROCODONE BIT-HOMATROP MBR 5-1.5 MG/5ML PO SOLN: 5.0000 mL | Freq: Four times a day (QID) | ORAL | 0 refills | Status: AC | PRN

## 2021-08-05 NOTE — Patient Instructions (Signed)
Please take all new medication as prescribed - the cough medicine as needed  Please continue all other medications as before, and refills have been done if requested.  Please have the pharmacy call with any other refills you may need  Please keep your appointments with your specialists as you may have planned   

## 2021-08-05 NOTE — Progress Notes (Signed)
Patient ID: Valerie [name], female   DOB: 2000/06/11, 21 y.o.   MRN: [id number] ? ? ? ?    Chief Complaint: follow up cough and anxiety ? ?     HPI:  Valerie [name] is a 21 y.o. female here with c/o persistent cough for over 3 wks associated with known allergic rhinitis with post nasal gtt and asthma symptoms followed closely per allergy with recent start nucala x 1 wk ago, with symptoms now just starting to lessen it seems.  Still has marked scant prod cough not better with otc preps, cannot sleep well,  Pt denies chest pain, increased sob or doe, wheezing, orthopnea, PND, increased LE swelling, palpitations, but has had some dizziness and near syncope with cough as well.    Pt denies fever, wt loss, night sweats, loss of appetite, or other constitutional symptoms  Has persistent low BP noted by specialists but this has been stable for several years and not associated with dizziness. Denies worsening depressive symptoms, suicidal ideation, does has ongoing anxiety, with occasiaonal panic but trying to work on non pharmacologic coping, does not want med for this, celexa already helping much.  Not preganant and no plans per pt.  ?      ?Wt Readings from Last 3 Encounters:  ?08/05/21 147 lb 3.2 oz (66.8 kg)  ?03/11/21 149 lb 6.4 oz (67.8 kg)  ?03/05/20 158 lb 12.8 oz (72 kg)  ? ?BP Readings from Last 3 Encounters:  ?08/05/21 118/60  ?03/11/21 114/76  ?03/01/21 121/80  ? ?      ?Past Medical History:  ?Diagnosis Date  ? Allergic rhinitis   ? Anxiety   ? Childhood asthma   ? Depression 05/19/2017  ? Migraine   ? Recurrent upper respiratory infection (URI)   ? ?History reviewed. No pertinent surgical history. ? reports that she is a non-smoker but has been exposed to tobacco smoke. She has never used smokeless tobacco. She reports current alcohol use. She reports that she does not use drugs. ?family history includes AAA (abdominal aortic aneurysm) in her maternal grandfather; Alcohol abuse in her maternal  grandfather; Arthritis in her maternal grandmother, mother, paternal grandfather, and paternal grandmother; Asthma in her mother; Bone cancer in her maternal grandmother; COPD in her maternal grandfather; Cancer in her maternal grandfather; Depression in her father, mother, and paternal grandmother; Heart disease in her maternal grandmother; Hyperlipidemia in her mother and paternal grandfather; Hypertension in her paternal grandmother; Stroke in her maternal grandmother. ?Allergies  ?Allergen Reactions  ? Promethazine-Dm Shortness Of Breath  ? ?Current Outpatient Medications on File Prior to Visit  ?Medication Sig Dispense Refill  ? albuterol (PROVENTIL HFA;VENTOLIN HFA) 108 (90 Base) MCG/ACT inhaler Inhale 2 puffs into the lungs every 6 (six) hours as needed for wheezing or shortness of breath. 18 g 11  ? Azelastine-Fluticasone 137-50 MCG/ACT SUSP Place 1 spray into the nose 2 (two) times daily as needed. 23 g 5  ? Budeson-Glycopyrrol-Formoterol (BREZTRI AEROSPHERE) 160-9-4.8 MCG/ACT AERO Inhale 2 puffs into the lungs in the morning and at bedtime. 10.7 g 5  ? citalopram (CELEXA) 40 MG tablet TAKE 1 TABLET BY MOUTH EVERY DAY 90 tablet 1  ? clindamycin (CLEOCIN) 300 MG capsule Take 300 mg by mouth 2 (two) times daily.    ? Mepolizumab (NUCALA) 100 MG/ML SOAJ Inject 1 mL (100 mg total) into the skin every 28 (twenty-eight) days. 1 mL 11  ? Olopatadine-Mometasone (RYALTRIS) [id number] MCG/ACT SUSP Place 2 sprays into the nose 2 (  two) times daily. 29 g 5  ? ondansetron (ZOFRAN ODT) 4 MG disintegrating tablet Take 1 tablet (4 mg total) by mouth every 8 (eight) hours as needed for nausea or vomiting. 20 tablet 0  ? predniSONE (DELTASONE) 10 MG tablet Day 1-3: 2 tablets twice a day, Day 4: Take 2 tablets at breakfast, Day 5: 1 tablet at breakfast and then stop 15 tablet 0  ? rizatriptan (MAXALT) 10 MG tablet TAKE 1 TABLET BY MOUTH AS NEEDED FOR MIGRAINE. MAY REPEAT IN 2 HOURS IF NEEDED 8 tablet 1  ? Sod Fluoride-Potassium  Nitrate 1.1-5 % PSTE PreviDent 5000 Sensitive 1.1 %-5 % dental paste ? USE AS DIRECTED 3 TIMES A DAY. DO NOT SWALLOW    ? Spacer/Aero-Holding Dorise Bullion Use as directed with inhaler. 1 each 0  ? triamcinolone (KENALOG) 0.1 % paste Use as directed 1 application in the mouth or throat 2 (two) times daily. 5 g 12  ? acetaminophen (TYLENOL) 500 MG tablet Take 500 mg by mouth every 6 (six) hours as needed. (Patient not taking: Reported on 07/27/2021)    ? HYDROcodone-acetaminophen (NORCO) 7.5-325 MG tablet Take 1 tablet by mouth every 6 (six) hours as needed. (Patient not taking: Reported on 08/05/2021)    ? ?No current facility-administered medications on file prior to visit.  ? ?     ROS:  All others reviewed and negative. ? ?Objective  ? ?     PE:  BP 118/60   Pulse 88   Resp 18   Ht 5\' 4"  (1.626 m)   Wt 147 lb 3.2 oz (66.8 kg)   LMP 08/02/2021   SpO2 98%   BMI 25.27 kg/m?  ? ?              Constitutional: Pt appears in NAD ?              HENT: Head: NCAT.  ?              Right Ear: External ear normal.   ?              Left Ear: External ear normal. Bilat tm's with mild erythema.  Max sinus areas non tender.  Pharynx with mild erythema, no exudate ?              Eyes: . Pupils are equal, round, and reactive to light. Conjunctivae and EOM are normal ?              Nose: without d/c or deformity ?              Neck: Neck supple. Gross normal ROM ?              Cardiovascular: Normal rate and regular rhythm.   ?              Pulmonary/Chest: Effort normal and breath sounds without rales or wheezing.  ?              Abd:  Soft, NT, ND, + BS, no organomegaly ?              Neurological: Pt is alert. At baseline orientation, motor grossly intact ?              Skin: Skin is warm. No rashes, no other new lesions, LE edema - none ?              Psychiatric: Pt behavior is normal without agitation , 1-2+ nervous ? ?  Micro: none ? ?Cardiac tracings I have personally interpreted today:  none ? ?Pertinent Radiological  findings (summarize): none  ? ?Lab Results  ?Component Value Date  ? WBC 6.5 03/11/2021  ? HGB 14.1 03/11/2021  ? HCT 41.0 03/11/2021  ? PLT 262 01/23/2020  ? GLUCOSE 82 01/23/2020  ? CHOL 134 05/19/2017  ? TRIG 99.0 05/19/2017  ? HDL 41.20 05/19/2017  ? [place] 73 05/19/2017  ? ALT 10 01/23/2020  ? AST 14 01/23/2020  ? NA 137 01/23/2020  ? K 4.4 01/23/2020  ? CL 103 01/23/2020  ? CREATININE 0.88 01/23/2020  ? BUN 9 01/23/2020  ? CO2 26 01/23/2020  ? TSH 4.25 01/23/2020  ? ?Assessment/Plan:  ?Valerie [name] is a 21 y.o. White or Caucasian [1] female with  has a past medical history of Allergic rhinitis, Anxiety, Childhood asthma, Depression (05/19/2017), Migraine, and Recurrent upper respiratory infection (URI). ? ?Allergic rhinitis ?Mild to mod improving with nucala but to restart otc antihistamine and nasacort as well prn,  to f/u any worsening symptoms or concerns ? ? ?Anxiety ?Mild uncontrolled with occasional panic, to continue celexa, declines referral for counseling or further new med tx ? ?Asthma ?Chronic persistent with recent mild worsening cough and sleep impairment - ok for cough med prn and suspect should be short term tx as nucala continues to improve symptoms ? ?Followup: Return if symptoms worsen or fail to improve. ? ?Cathlean Cower, MD 08/06/2021 3:50 AM ?[facility] ?[facility] ?Internal Medicine ?

## 2021-08-06 ENCOUNTER — Encounter: Payer: Self-pay | Admitting: Internal Medicine

## 2021-08-06 NOTE — Assessment & Plan Note (Signed)
Mild uncontrolled with occasional panic, to continue celexa, declines referral for counseling or further new med tx ?

## 2021-08-06 NOTE — Assessment & Plan Note (Signed)
Mild to mod improving with nucala but to restart otc antihistamine and nasacort as well prn,  to f/u any worsening symptoms or concerns ? ?

## 2021-08-06 NOTE — Assessment & Plan Note (Signed)
Chronic persistent with recent mild worsening cough and sleep impairment - ok for cough med prn and suspect should be short term tx as nucala continues to improve symptoms ?

## 2021-08-18 ENCOUNTER — Other Ambulatory Visit: Payer: Self-pay

## 2021-08-18 ENCOUNTER — Other Ambulatory Visit: Payer: Self-pay | Admitting: Internal Medicine

## 2021-08-18 ENCOUNTER — Ambulatory Visit
Admission: EM | Admit: 2021-08-18 | Discharge: 2021-08-18 | Disposition: A | Discharge status: Home / Self Care | Payer: BC Managed Care – PPO | Attending: Physician Assistant | Admitting: Physician Assistant

## 2021-08-18 ENCOUNTER — Ambulatory Visit: Admit: 2021-08-18 | Payer: BC Managed Care – PPO

## 2021-08-18 ENCOUNTER — Encounter: Payer: Self-pay | Admitting: Emergency Medicine

## 2021-08-18 DIAGNOSIS — J069 Acute upper respiratory infection, unspecified: Secondary | ICD-10-CM | POA: Diagnosis not present

## 2021-08-18 DIAGNOSIS — J019 Acute sinusitis, unspecified: Secondary | ICD-10-CM

## 2021-08-18 LAB — POCT RAPID STREP A (OFFICE): Rapid Strep A Screen: NEGATIVE

## 2021-08-18 MED ORDER — AMOXICILLIN-POT CLAVULANATE 875-125 MG PO TABS: 1.0000 | ORAL_TABLET | Freq: Two times a day (BID) | ORAL | 0 refills | Status: DC

## 2021-08-18 NOTE — ED Triage Notes (Signed)
Pt here for sore throat and nasal congestion with some fever and productive cough  ?

## 2021-08-18 NOTE — ED Provider Notes (Signed)
?[place] ? ? ? ?CSN: [id number] ?Arrival date & time: 08/18/21  1656 ? ? ?  ? ?History   ?Chief Complaint ?Chief Complaint  ?Patient presents with  ? Sore Throat  ? ? ?HPI ?Valerie [name] is a 21 y.o. female.  ? ?Patient here today for evaluation of sore throat, congestion and fever that started about 5 days ago.  She reports that she has had some productive cough as well.  Symptoms seem to be worsening instead of improving.  She notes initially she thought symptoms were related to allergies due to congestion however given worsening she became concerned.  She has tried over-the-counter medication without significant relief. ? ?The history is provided by the patient.  ?Sore Throat ? ? ?Past Medical History:  ?Diagnosis Date  ? Allergic rhinitis   ? Anxiety   ? Childhood asthma   ? Depression 05/19/2017  ? Migraine   ? Recurrent upper respiratory infection (URI)   ? ? ?Patient Active Problem List  ? Diagnosis Date Noted  ? Sinusitis 12/20/2020  ? Neck pain 01/23/2020  ? Chest pain 01/23/2020  ? Dysuria 01/23/2020  ? Other fatigue 04/28/2019  ? Chronic sinusitis 04/28/2019  ? Insomnia 11/13/2018  ? Aphthous ulcer of mouth 11/13/2018  ? Deviated nasal septum 11/13/2018  ? Asthma 12/27/2017  ? Ear pain, bilateral 12/01/2017  ? Cough 11/16/2017  ? Urinary tract infection without hematuria 05/22/2017  ? Depression 05/19/2017  ? Encounter for well adult exam with abnormal findings 05/19/2017  ? Mild intermittent asthma   ? Allergic rhinitis   ? Migraine   ? Anxiety   ? ? ?History reviewed. No pertinent surgical history. ? ?OB History   ?No obstetric history on file. ?  ? ? ? ?Home Medications   ? ?Prior to Admission medications   ?Medication Sig Start Date End Date Taking? Authorizing Provider  ?amoxicillin-clavulanate (AUGMENTIN) 875-125 MG tablet Take 1 tablet by mouth every 12 (twelve) hours. 08/18/21  Yes Francene Finders, PA-C  ?acetaminophen (TYLENOL) 500 MG tablet Take 500 mg by mouth every 6 (six)  hours as needed. ?Patient not taking: Reported on 07/27/2021    [provider]  ?albuterol (PROVENTIL HFA;VENTOLIN HFA) 108 (90 Base) MCG/ACT inhaler Inhale 2 puffs into the lungs every 6 (six) hours as needed for wheezing or shortness of breath. 12/27/17   Biagio Borg, MD  ?Azelastine-Fluticasone 137-50 MCG/ACT SUSP Place 1 spray into the nose 2 (two) times daily as needed. 02/15/18   Kennith Gain, MD  ?Budeson-Glycopyrrol-Formoterol (BREZTRI AEROSPHERE) 160-9-4.8 MCG/ACT AERO Inhale 2 puffs into the lungs in the morning and at bedtime. 03/11/21   Kennith Gain, MD  ?citalopram (CELEXA) 40 MG tablet TAKE 1 TABLET BY MOUTH EVERY DAY 06/29/21   Biagio Borg, MD  ?HYDROcodone-acetaminophen (NORCO) 7.5-325 MG tablet Take 1 tablet by mouth every 6 (six) hours as needed. ?Patient not taking: Reported on 08/05/2021 07/23/21   [provider]  ?Mepolizumab (NUCALA) 100 MG/ML SOAJ Inject 1 mL (100 mg total) into the skin every 28 (twenty-eight) days. 07/27/21   Kennith Gain, MD  ?Olopatadine-Mometasone Rennie [phone] MCG/ACT SUSP Place 2 sprays into the nose 2 (two) times daily. 03/11/21   Kennith Gain, MD  ?ondansetron (ZOFRAN ODT) 4 MG disintegrating tablet Take 1 tablet (4 mg total) by mouth every 8 (eight) hours as needed for nausea or vomiting. 03/21/19   [place], [place], PA-C  ?predniSONE (DELTASONE) 10 MG tablet Day 1-3: 2 tablets twice  a day, Day 4: Take 2 tablets at breakfast, Day 5: 1 tablet at breakfast and then stop ?Patient not taking: Reported on 08/18/2021 07/27/21   Althea Charon, [place]  ?rizatriptan (MAXALT) 10 MG tablet TAKE 1 TABLET BY MOUTH AS NEEDED FOR MIGRAINE. MAY REPEAT IN 2 HOURS IF NEEDED 08/18/21   Biagio Borg, MD  ?Sod Fluoride-Potassium Nitrate 1.1-5 % PSTE PreviDent 5000 Sensitive 1.1 %-5 % dental paste ? USE AS DIRECTED 3 TIMES A DAY. DO NOT SWALLOW    [provider]  ?Spacer/Aero-Holding Dorise Bullion Use as  directed with inhaler. 07/27/21   Althea Charon, [place]  ?triamcinolone (KENALOG) 0.1 % paste Use as directed 1 application in the mouth or throat 2 (two) times daily. 11/13/18   Biagio Borg, MD  ? ? ?Family History ?Family History  ?Problem Relation Age of Onset  ? Arthritis Mother   ? Asthma Mother   ? Depression Mother   ? Hyperlipidemia Mother   ? Depression Father   ? Arthritis Maternal Grandmother   ? Bone cancer Maternal Grandmother   ? Heart disease Maternal Grandmother   ? Stroke Maternal Grandmother   ? Alcohol abuse Maternal Grandfather   ? COPD Maternal Grandfather   ? Cancer Maternal Grandfather   ? AAA (abdominal aortic aneurysm) Maternal Grandfather   ? Arthritis Paternal Grandmother   ? Depression Paternal Grandmother   ? Hypertension Paternal Grandmother   ? Arthritis Paternal Grandfather   ? Hyperlipidemia Paternal Grandfather   ? ? ?Social History ?Social History  ? ?Tobacco Use  ? Smoking status: Passive Smoke Exposure - Never Smoker  ? Smokeless tobacco: Never  ? Tobacco comments:  ?  mom smokes both inside and outside  ?Vaping Use  ? Vaping Use: Former  ? Devices: 3 years ago, for about 2 months and then she quit  ?Substance Use Topics  ? Alcohol use: Yes  ?  Comment: 0cc  ? Drug use: No  ? ? ? ?Allergies   ?Promethazine-dm ? ? ?Review of Systems ?Review of Systems  ?Constitutional:  Positive for fatigue and fever.  ?HENT:  Positive for congestion, ear pain and sore throat.   ?Respiratory:  Positive for cough.   ?Gastrointestinal:  Negative for diarrhea, nausea and vomiting.  ? ? ?Physical Exam ?Triage Vital Signs ?ED Triage Vitals  ?Enc Vitals Group  ?   BP 08/18/21 1941 117/81  ?   Pulse Rate 08/18/21 1941 91  ?   Resp 08/18/21 1941 18  ?   Temp 08/18/21 1941 (!) 97.5 ?F (36.4 ?C)  ?   Temp Source 08/18/21 1941 Oral  ?   SpO2 08/18/21 1941 98 %  ?   Weight --   ?   Height --   ?   Head Circumference --   ?   Peak Flow --   ?   Pain Score 08/18/21 1942 6  ?   Pain Loc --   ?   Pain Edu? --   ?    Excl. in [place]? --   ? ?No data found. ? ?Updated Vital Signs ?BP 117/81 (BP Location: Left Arm)   Pulse 91   Temp (!) 97.5 ?F (36.4 ?C) (Oral)   Resp 18   LMP 08/02/2021   SpO2 98%  ?   ? ?Physical Exam ?Vitals and nursing note reviewed.  ?Constitutional:   ?   General: She is not in acute distress. ?   Appearance: Normal appearance. She is not ill-appearing.  ?  HENT:  ?   Head: Normocephalic and atraumatic.  ?   Right Ear: Tympanic membrane normal.  ?   Left Ear: Tympanic membrane normal.  ?   Nose: Congestion present.  ?   Mouth/Throat:  ?   Mouth: Mucous membranes are moist.  ?   Pharynx: Posterior oropharyngeal erythema present. No oropharyngeal exudate.  ?Eyes:  ?   Conjunctiva/sclera: Conjunctivae normal.  ?Cardiovascular:  ?   Rate and Rhythm: Normal rate and regular rhythm.  ?   Heart sounds: Normal heart sounds. No murmur heard. ?Pulmonary:  ?   Effort: Pulmonary effort is normal. No respiratory distress.  ?   Breath sounds: Normal breath sounds. No wheezing, rhonchi or rales.  ?Skin: ?   General: Skin is warm and dry.  ?Neurological:  ?   Mental Status: She is alert.  ?Psychiatric:     ?   Mood and Affect: Mood normal.     ?   Thought Content: Thought content normal.  ? ? ? ?UC Treatments / Results  ?Labs ?(all labs ordered are listed, but only abnormal results are displayed) ?Labs Reviewed  ?CULTURE, GROUP A STREP [facility])  ?COVID-19, FLU A+B NAA  ?POCT RAPID STREP A (OFFICE)  ? ? ?EKG ? ? ?Radiology ?No results found. ? ?Procedures ?Procedures (including critical care time) ? ?Medications Ordered in UC ?Medications - No data to display ? ?Initial Impression / Assessment and Plan / UC Course  ?I have reviewed the triage vital signs and the nursing notes. ? ?Pertinent labs & imaging results that were available during my care of the patient were reviewed by me and considered in my medical decision making (see chart for details). ? ?  ?We will treat to cover possible sinusitis given worsening congestion and  pressure with fever.  Discussed possibility of other viral illness and will also screen for COVID/ Flu.  Strep test negative in office- will order throat culture.  Encouraged follow-up with any further concerns

## 2021-08-18 NOTE — ED Triage Notes (Signed)
Pt did not answer x 1 

## 2021-08-19 LAB — COVID-19, FLU A+B NAA
Influenza A, NAA: NOT DETECTED
Influenza B, NAA: NOT DETECTED
SARS-CoV-2, NAA: NOT DETECTED

## 2021-08-21 LAB — CULTURE, GROUP A STREP (THRC)

## 2021-09-02 ENCOUNTER — Telehealth: Payer: Self-pay | Admitting: *Deleted

## 2021-09-02 NOTE — Telephone Encounter (Signed)
Called and informed patient. Patient verbalized understanding.  

## 2021-09-02 NOTE — Telephone Encounter (Signed)
Pt called and stated her Nucala injection caused a bruise the size of a dime and its the first time its happened and wanted to know if it was concerning. She reported it was not swollen, red, hot or raised. I explained it could have been due to the location and if it nicked a surface vein. Told her that it is not alarming but I would let Dr. Delorse Lek know.  ?

## 2021-10-01 ENCOUNTER — Ambulatory Visit: Payer: BC Managed Care – PPO | Admitting: Allergy

## 2021-10-01 DIAGNOSIS — J309 Allergic rhinitis, unspecified: Secondary | ICD-10-CM

## 2021-10-11 ENCOUNTER — Ambulatory Visit
Admission: RE | Admit: 2021-10-11 | Discharge: 2021-10-11 | Disposition: A | Discharge status: Home / Self Care | Payer: BC Managed Care – PPO | Source: Ambulatory Visit | Attending: Emergency Medicine | Admitting: Emergency Medicine

## 2021-10-11 VITALS — BP 122/84 | HR 80 | Temp 98.3°F | Resp 20

## 2021-10-11 DIAGNOSIS — N946 Dysmenorrhea, unspecified: Secondary | ICD-10-CM | POA: Diagnosis not present

## 2021-10-11 DIAGNOSIS — Z309 Encounter for contraceptive management, unspecified: Secondary | ICD-10-CM | POA: Diagnosis not present

## 2021-10-11 MED ORDER — NORETHIN ACE-ETH ESTRAD-FE 1-20 MG-MCG(24) PO TABS
ORAL_TABLET | ORAL | 3 refills | Status: DC
Start: 2021-10-11 — End: 2022-02-15

## 2021-10-11 MED ORDER — KETOROLAC TROMETHAMINE 60 MG/2ML IM SOLN
60.0000 mg | Freq: Once | INTRAMUSCULAR | Status: AC
  Administered 2021-10-11: 60 mg via INTRAMUSCULAR

## 2021-10-11 MED ORDER — NAPROXEN 500 MG PO TABS: 500.0000 mg | ORAL_TABLET | Freq: Two times a day (BID) | ORAL | 0 refills | Status: DC

## 2021-10-11 NOTE — ED Notes (Signed)
No UA was done per PA

## 2021-10-11 NOTE — ED Provider Notes (Signed)
[facility]    CSN: [id number] Arrival date & time: 10/11/21  1427    HISTORY   Chief Complaint  Patient presents with   Abdominal Pain   Back Pain   HPI Valerie [name] is a 21 y.o. female. Pt here with abnormally strong abdominal cramping from menstrual cycle that radiates to back and causes her to throw up x 4 days. Pt states periods are rarely painful, not currently passing blood clots, is sexually active, using ocp but states rx is about 23 years old because she stopped taking them for a few years then restarted them a few months ago.  Last period was normal.  UPT today is negative.   The history is provided by the patient.  Past Medical History:  Diagnosis Date   Allergic rhinitis    Anxiety    Childhood asthma    Depression 05/19/2017   Migraine    Recurrent upper respiratory infection (URI)    Patient Active Problem List   Diagnosis Date Noted   Sinusitis 12/20/2020   Neck pain 01/23/2020   Chest pain 01/23/2020   Dysuria 01/23/2020   Other fatigue 04/28/2019   Chronic sinusitis 04/28/2019   Insomnia 11/13/2018   Aphthous ulcer of mouth 11/13/2018   Deviated nasal septum 11/13/2018   Asthma 12/27/2017   Ear pain, bilateral 12/01/2017   Cough 11/16/2017   Urinary tract infection without hematuria 05/22/2017   Depression 05/19/2017   Encounter for well adult exam with abnormal findings 05/19/2017   Mild intermittent asthma    Allergic rhinitis    Migraine    Anxiety    History reviewed. No pertinent surgical history. OB History   No obstetric history on file.    Home Medications    Prior to Admission medications   Medication Sig Start Date End Date Taking? Authorizing Provider  naproxen (NAPROSYN) 500 MG tablet Take 1 tablet (500 mg total) by mouth 2 (two) times daily. 10/11/21  Yes Theadora RamaMorgan, Barba Solt Scales, PA-C  Norethindrone Acetate-Ethinyl Estrad-FE (LOESTRIN 24 FE) 1-20 MG-MCG(24) tablet Take 1 active tablet daily for 84 days.  Take  not take placebo tablets provided in first 3 packs.  Take 1 placebo tablets when you complete the fourth pack.  Refill prescription and repeat. 10/11/21  Yes Theadora RamaMorgan, Lataya Varnell Scales, PA-C  albuterol (PROVENTIL HFA;VENTOLIN HFA) 108 (90 Base) MCG/ACT inhaler Inhale 2 puffs into the lungs every 6 (six) hours as needed for wheezing or shortness of breath. 12/27/17   Corwin LevinsJohn, James W, MD  Azelastine-Fluticasone [phone] MCG/ACT SUSP Place 1 spray into the nose 2 (two) times daily as needed. 02/15/18   Marcelyn BruinsPadgett, Shaylar Patricia, MD  Budeson-Glycopyrrol-Formoterol (BREZTRI AEROSPHERE) 160-9-4.8 MCG/ACT AERO Inhale 2 puffs into the lungs in the morning and at bedtime. 03/11/21   Marcelyn BruinsPadgett, Shaylar Patricia, MD  citalopram (CELEXA) 40 MG tablet TAKE 1 TABLET BY MOUTH EVERY DAY 06/29/21   Corwin LevinsJohn, James W, MD  Mepolizumab (NUCALA) 100 MG/ML SOAJ Inject 1 mL (100 mg total) into the skin every 28 (twenty-eight) days. 07/27/21   Marcelyn BruinsPadgett, Shaylar Patricia, MD  Olopatadine-Mometasone Cristal Generous(RYALTRIS) [phone] MCG/ACT SUSP Place 2 sprays into the nose 2 (two) times daily. 03/11/21   Marcelyn BruinsPadgett, Shaylar Patricia, MD  rizatriptan (MAXALT) 10 MG tablet TAKE 1 TABLET BY MOUTH AS NEEDED FOR MIGRAINE. MAY REPEAT IN 2 HOURS IF NEEDED 08/18/21   Corwin LevinsJohn, James W, MD  Sod Fluoride-Potassium Nitrate 1.1-5 % PSTE PreviDent 5000 Sensitive 1.1 %-5 % dental paste  USE AS DIRECTED 3 TIMES A  DAY. DO NOT SWALLOW    [provider]  Spacer/Aero-Holding Rudean Curt Use as directed with inhaler. 07/27/21   Nehemiah Settle, FNP    Family History Family History  Problem Relation Age of Onset   Arthritis Mother    Asthma Mother    Depression Mother    Hyperlipidemia Mother    Depression Father    Arthritis Maternal Grandmother    Bone cancer Maternal Grandmother    Heart disease Maternal Grandmother    Stroke Maternal Grandmother    Alcohol abuse Maternal Grandfather    COPD Maternal Grandfather    Cancer Maternal Grandfather    AAA (abdominal aortic  aneurysm) Maternal Grandfather    Arthritis Paternal Grandmother    Depression Paternal Grandmother    Hypertension Paternal Grandmother    Arthritis Paternal Grandfather    Hyperlipidemia Paternal Grandfather    Social History Social History   Tobacco Use   Smoking status: Passive Smoke Exposure - Never Smoker   Smokeless tobacco: Never   Tobacco comments:    mom smokes both inside and outside  Vaping Use   Vaping Use: Former   Devices: 3 years ago, for about 2 months and then she quit  Substance Use Topics   Alcohol use: Yes    Comment: 0cc   Drug use: No   Allergies   Promethazine-dm  Review of Systems Review of Systems Pertinent findings noted in history of present illness.   Physical Exam Triage Vital Signs ED Triage Vitals  Enc Vitals Group     BP 03/20/21 0827 (!) 147/82     Pulse Rate 03/20/21 0827 72     Resp 03/20/21 0827 18     Temp 03/20/21 0827 98.3 F (36.8 C)     Temp Source 03/20/21 0827 Oral     SpO2 03/20/21 0827 98 %     Weight --      Height --      Head Circumference --      Peak Flow --      Pain Score 03/20/21 0826 5     Pain Loc --      Pain Edu? --      Excl. in [place]? --   No data found.  Updated Vital Signs BP 122/84   Pulse 80   Temp 98.3 F (36.8 C)   Resp 20   LMP 10/08/2021   SpO2 98%   Physical Exam Vitals and nursing note reviewed.  Constitutional:      General: She is awake. She is not in acute distress.    Appearance: Normal appearance. She is well-developed, well-groomed and normal weight. She is not ill-appearing.     Comments: Pt is in intermittent mild distress 2/2 cyclical uterine cramps  HENT:     Head: Normocephalic and atraumatic.  Eyes:     General: Lids are normal.        Right eye: No discharge.        Left eye: No discharge.     Extraocular Movements: Extraocular movements intact.     Conjunctiva/sclera: Conjunctivae normal.     Right eye: Right conjunctiva is not injected.     Left eye: Left  conjunctiva is not injected.  Neck:     Trachea: Trachea and phonation normal.  Cardiovascular:     Rate and Rhythm: Normal rate and regular rhythm.     Pulses: Normal pulses.     Heart sounds: Normal heart sounds. No murmur heard.   No  friction rub. No gallop.  Pulmonary:     Effort: Pulmonary effort is normal. No accessory muscle usage, prolonged expiration or respiratory distress.     Breath sounds: Normal breath sounds. No stridor, decreased air movement or transmitted upper airway sounds. No decreased breath sounds, wheezing, rhonchi or rales.  Chest:     Chest wall: No tenderness.  Musculoskeletal:        General: Normal range of motion.     Cervical back: Normal range of motion and neck supple. Normal range of motion.  Lymphadenopathy:     Cervical: No cervical adenopathy.  Skin:    General: Skin is warm and dry.     Findings: No erythema or rash.  Neurological:     General: No focal deficit present.     Mental Status: She is alert and oriented to person, place, and time.  Psychiatric:        Mood and Affect: Mood normal.        Behavior: Behavior normal. Behavior is cooperative.    Visual Acuity Right Eye Distance:   Left Eye Distance:   Bilateral Distance:    Right Eye Near:   Left Eye Near:    Bilateral Near:     UC Couse / Diagnostics / Procedures:    EKG  Radiology No results found.  Procedures Procedures (including critical care time)  UC Diagnoses / Final Clinical Impressions(s)   I have reviewed the triage vital signs and the nursing notes.  Pertinent labs & imaging results that were available during my care of the patient were reviewed by me and considered in my medical decision making (see chart for details).    Final diagnoses:  Dysmenorrhea  Encounter for contraceptive management, unspecified type   Pt provided with ketorolac inj during visit and naproxen 500 mg rx.  Pt provided with new rx for OCPs, advised to discard expired packs.  Pt  placed on 3 month cycles with 4 packs per cycle. Return precautions advised.   ED Prescriptions     Medication Sig Dispense Auth. Provider   naproxen (NAPROSYN) 500 MG tablet Take 1 tablet (500 mg total) by mouth 2 (two) times daily. 30 tablet Theadora Rama Scales, PA-C   Norethindrone Acetate-Ethinyl Estrad-FE (LOESTRIN 24 FE) 1-20 MG-MCG(24) tablet Take 1 active tablet daily for 84 days.  Take not take placebo tablets provided in first 3 packs.  Take 1 placebo tablets when you complete the fourth pack.  Refill prescription and repeat. 93 tablet Theadora Rama Scales, PA-C      PDMP not reviewed this encounter.  Pending results:  Labs Reviewed  POCT URINALYSIS DIP (MANUAL ENTRY)  POCT URINE PREGNANCY  POCT URINE PREGNANCY    Medications Ordered in UC: Medications  ketorolac (TORADOL) injection 60 mg (60 mg Intramuscular Given 10/11/21 1518)    Disposition Upon Discharge:  Condition: stable for discharge home Home: take medications as prescribed; routine discharge instructions as discussed; follow up as advised.  Patient presented with an acute illness with associated systemic symptoms and significant discomfort requiring urgent management. In my opinion, this is a condition that a prudent lay person (someone who possesses an average knowledge of health and medicine) may potentially expect to result in complications if not addressed urgently such as respiratory distress, impairment of bodily function or dysfunction of bodily organs.   Routine symptom specific, illness specific and/or disease specific instructions were discussed with the patient and/or caregiver at length.   As such, the patient has been evaluated  and assessed, work-up was performed and treatment was provided in alignment with urgent care protocols and evidence based medicine.  Patient/parent/caregiver has been advised that the patient may require follow up for further testing and treatment if the symptoms continue  in spite of treatment, as clinically indicated and appropriate.  Patient/parent/caregiver has been advised to return to the [facility] or PCP if no better; to PCP or the Emergency Department if new signs and symptoms develop, or if the current signs or symptoms continue to change or worsen for further workup, evaluation and treatment as clinically indicated and appropriate  The patient will follow up with their current PCP if and as advised. If the patient does not currently have a PCP we will assist them in obtaining one.   The patient may need specialty follow up if the symptoms continue, in spite of conservative treatment and management, for further workup, evaluation, consultation and treatment as clinically indicated and appropriate.   Patient/parent/caregiver verbalized understanding and agreement of plan as discussed.  All questions were addressed during visit.  Please see discharge instructions below for further details of plan.  Discharge Instructions:   Discharge Instructions      You are provided with an injection of ketorolac today to rapidly relieve your pain.  I provided you with a prescription for naproxen tablets that I would like you to take twice daily until your menstrual bleeding has ended.  This will keep your uterine contractions minimize which will help prevent pain while you continue to bleed.  I believe that the birth control pills that you are taking are likely expired.  As with any other medication, expired birth control pills tend to lose the potency and become less effective over time.  Your urine pregnancy test today is negative.  I provided you with a new prescription for birth control similar to Lo Loestrin called Loestrin.  It contains the same dose of estrogen but a higher dose of the progesterone component.  Progesterone is the hormone that stimulates your uterus to stop shedding blood.  Please begin taking them as soon as you pick them up.  I have also provided you  with for birth control pill packs for 3 months of birth control.  Please take the active pills only during the first 3 packs.  After you complete the active pills in the fourth pack, take the 7 placebo pills once daily until complete.  Be sure you have refilled this prescription before you run out of this placebo pills so that you can begin this cycle on time.  Please let us know if, after 3 months of this new birth control pill, you are still having difficulty or reach out to your gynecologist for further evaluation and treatment.  Thank you for visiting urgent care today.      This office note has been dictated using Teaching laboratory technician.  Unfortunately, and despite my best efforts, this method of dictation can sometimes lead to occasional typographical or grammatical errors.  I apologize in advance if this occurs.     Theadora Rama Scales, PA-C 10/12/21 1243

## 2021-10-11 NOTE — ED Triage Notes (Signed)
Pt here with abnormally strong abdominal cramping from menstrual cycle that radiates to back and causes her to throw up x 4 days.

## 2021-10-11 NOTE — Discharge Instructions (Signed)
You are provided with an injection of ketorolac today to rapidly relieve your pain.  I provided you with a prescription for naproxen tablets that I would like you to take twice daily until your menstrual bleeding has ended.  This will keep your uterine contractions minimize which will help prevent pain while you continue to bleed.  I believe that the birth control pills that you are taking are likely expired.  As with any other medication, expired birth control pills tend to lose the potency and become less effective over time.  Your urine pregnancy test today is negative.  I provided you with a new prescription for birth control similar to Lo Loestrin called Loestrin.  It contains the same dose of estrogen but a higher dose of the progesterone component.  Progesterone is the hormone that stimulates your uterus to stop shedding blood.  Please begin taking them as soon as you pick them up.  I have also provided you with for birth control pill packs for 3 months of birth control.  Please take the active pills only during the first 3 packs.  After you complete the active pills in the fourth pack, take the 7 placebo pills once daily until complete.  Be sure you have refilled this prescription before you run out of this placebo pills so that you can begin this cycle on time.  Please let us know if, after 3 months of this new birth control pill, you are still having difficulty or reach out to your gynecologist for further evaluation and treatment.  Thank you for visiting urgent care today.

## 2021-11-20 ENCOUNTER — Ambulatory Visit: Payer: BC Managed Care – PPO | Admitting: Family Medicine

## 2021-11-23 ENCOUNTER — Ambulatory Visit (INDEPENDENT_AMBULATORY_CARE_PROVIDER_SITE_OTHER): Payer: BC Managed Care – PPO | Admitting: Internal Medicine

## 2021-11-23 ENCOUNTER — Encounter: Payer: Self-pay | Admitting: Internal Medicine

## 2021-11-23 VITALS — BP 100/72 | HR 88 | Ht 64.0 in | Wt 146.0 lb

## 2021-11-23 DIAGNOSIS — J029 Acute pharyngitis, unspecified: Secondary | ICD-10-CM

## 2021-11-23 DIAGNOSIS — J4520 Mild intermittent asthma, uncomplicated: Secondary | ICD-10-CM | POA: Diagnosis not present

## 2021-11-23 DIAGNOSIS — R221 Localized swelling, mass and lump, neck: Secondary | ICD-10-CM | POA: Insufficient documentation

## 2021-11-23 MED ORDER — DOXYCYCLINE HYCLATE 100 MG PO TABS: 100.0000 mg | ORAL_TABLET | Freq: Two times a day (BID) | ORAL | 0 refills | Status: DC

## 2021-11-23 NOTE — Assessment & Plan Note (Signed)
Also for ENT referral per pt request,  to f/u any worsening symptoms or concerns

## 2021-11-23 NOTE — Assessment & Plan Note (Signed)
Mild to mod, for antibx course - doxycycline,  to f/u any worsening symptoms or concerns 

## 2021-11-23 NOTE — Patient Instructions (Signed)
Please take all new medication as prescribed - the antibiotic  Please continue all other medications as before, and refills have been done if requested.  Please have the pharmacy call with any other refills you may need.  Please keep your appointments with your specialists as you may have planned  You will be contacted regarding the referral for: ENT   

## 2021-11-23 NOTE — Progress Notes (Signed)
Patient ID: Christa See. Soller, female   DOB: 11/23/2000, 20 y.o.   MRN: [id number]        Chief Complaint: follow up lump in throat feeling, and tender knot to mid left anterior neck       HPI:  Desirie R. Flannery is a 21 y.o. 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   Pt denies polydipsia, polyuria, or new focal neuro s/s.   Would like to avoid steroid tx if possible.          Wt Readings from Last 3 Encounters:  11/23/21 146 lb (66.2 kg)  08/05/21 147 lb 3.2 oz (66.8 kg)  03/11/21 149 lb 6.4 oz (67.8 kg)   BP Readings from Last 3 Encounters:  11/23/21 100/72  10/11/21 122/84  08/18/21 117/81         Past Medical History:  Diagnosis Date   Allergic rhinitis    Anxiety    Childhood asthma    Depression 05/19/2017   Migraine    Recurrent upper respiratory infection (URI)    History reviewed. No pertinent surgical history.  reports that she has never smoked. She has been exposed to tobacco smoke. She has never used smokeless tobacco. She reports current alcohol use. She reports that she does not use drugs. family history includes AAA (abdominal aortic aneurysm) in her maternal grandfather; Alcohol abuse in her maternal grandfather; Arthritis in her maternal grandmother, mother, paternal grandfather, and paternal grandmother; Asthma in her mother; Bone cancer in her maternal grandmother; COPD in her maternal grandfather; Cancer in her maternal grandfather; Depression in her father, mother, and paternal grandmother; Heart disease in her maternal grandmother; Hyperlipidemia in her mother and paternal grandfather; Hypertension in her  paternal grandmother; Stroke in her maternal grandmother. Allergies  Allergen Reactions   Promethazine-Dm Shortness Of Breath   Current Outpatient Medications on File Prior to Visit  Medication Sig Dispense Refill   albuterol (PROVENTIL HFA;VENTOLIN HFA) 108 (90 Base) MCG/ACT inhaler Inhale 2 puffs into the lungs every 6 (six) hours as needed for wheezing or shortness of breath. 18 g 11   Budeson-Glycopyrrol-Formoterol (BREZTRI AEROSPHERE) 160-9-4.8 MCG/ACT AERO Inhale 2 puffs into the lungs in the morning and at bedtime. 10.7 g 5   citalopram (CELEXA) 40 MG tablet TAKE 1 TABLET BY MOUTH EVERY DAY 90 tablet 1   Mepolizumab (NUCALA) 100 MG/ML SOAJ Inject 1 mL (100 mg total) into the skin every 28 (twenty-eight) days. 1 mL 11   naproxen (NAPROSYN) 500 MG tablet Take 1 tablet (500 mg total) by mouth 2 (two) times daily. 30 tablet 0   Norethindrone Acetate-Ethinyl Estrad-FE (LOESTRIN 24 FE) 1-20 MG-MCG(24) tablet Take 1 active tablet daily for 84 days.  Take not take placebo tablets provided in first 3 packs.  Take 1 placebo tablets when you complete the fourth pack.  Refill prescription and repeat. 93 tablet 3   Olopatadine-Mometasone (RYALTRIS) 665-25 MCG/ACT SUSP Place 2 sprays into the nose 2 (two) times daily. 29 g 5   rizatriptan (MAXALT) 10 MG tablet TAKE 1 TABLET BY MOUTH AS NEEDED FOR MIGRAINE. MAY REPEAT IN 2 HOURS IF NEEDED 8 tablet 1  Sod Fluoride-Potassium Nitrate 1.1-5 % PSTE PreviDent 5000 Sensitive 1.1 %-5 % dental paste  USE AS DIRECTED 3 TIMES A DAY. DO NOT SWALLOW     Spacer/Aero-Holding Rudean Curt Use as directed with inhaler. 1 each 0   No current facility-administered medications on file prior to visit.        ROS:  All others reviewed and negative.  Objective        PE:  BP 100/72 (BP Location: Left Arm, Patient Position: Sitting, Cuff Size: Normal)   Pulse 88   Ht 5\' 4"  (1.626 m)   Wt 146 lb (66.2 kg)   SpO2 98%   BMI 25.06 kg/m                 Constitutional:  Pt appears in NAD, non toxic               HENT: Head: NCAT.                Right Ear: External ear normal.                 Left Ear: External ear normal.                Eyes: . Pupils are equal, round, and reactive to light. Conjunctivae and EOM are normal               Nose: without d/c or deformity; pharynx with diffuse erythema but no cobblestone, post nasal gtt, and has probable mild enlargement left tonsillar area               Neck: Neck supple. Gross normal ROM; left mid anterior neck with likely > 1 cm tender nodule like mass probable LN to mid left SCM anterior chain               Cardiovascular: Normal rate and regular rhythm.                 Pulmonary/Chest: Effort normal and breath sounds without rales or wheezing.                Abd:  Soft, NT, ND, + BS, no organomegaly - no masses               Neurological: Pt is alert. At baseline orientation, motor grossly intact               Skin: Skin is warm. No rashes, no other new lesions, LE edema - none               Psychiatric: Pt behavior is normal without agitation   Micro: none  Cardiac tracings I have personally interpreted today:  none  Pertinent Radiological findings (summarize): none   Lab Results  Component Value Date   WBC 6.5 03/11/2021   HGB 14.1 03/11/2021   HCT 41.0 03/11/2021   PLT 262 01/23/2020   GLUCOSE 82 01/23/2020   CHOL 134 05/19/2017   TRIG 99.0 05/19/2017   HDL 41.20 05/19/2017   LDLCALC 73 05/19/2017   ALT 10 01/23/2020   AST 14 01/23/2020   NA 137 01/23/2020   K 4.4 01/23/2020   CL 103 01/23/2020   CREATININE 0.88 01/23/2020   BUN 9 01/23/2020   CO2 26 01/23/2020   TSH 4.25 01/23/2020   Assessment/Plan:  Selene R. Smedberg is a 21 y.o. White or Caucasian [1] female with  has a past medical history of Allergic rhinitis, Anxiety, Childhood asthma, Depression (05/19/2017),  Migraine, and Recurrent upper respiratory infection (URI).  Pharyngitis Mild to mod, for antibx course - doxycycline,   to f/u any worsening symptoms or concerns  Localized swelling, mass and lump, neck Also for ENT referral per pt request,  to f/u any worsening symptoms or concerns  Mild intermittent asthma Currently stable, cont nucala and inhaler prn  Followup: Return if symptoms worsen or fail to improve.  Oliver Barre, MD 11/23/2021 8:19 PM [facility] [place] [facility]

## 2021-11-23 NOTE — Assessment & Plan Note (Signed)
Currently stable, cont nucala and inhaler prn

## 2021-11-25 ENCOUNTER — Other Ambulatory Visit: Payer: Self-pay | Admitting: Internal Medicine

## 2021-11-25 ENCOUNTER — Telehealth: Payer: Self-pay | Admitting: Internal Medicine

## 2021-11-25 NOTE — Telephone Encounter (Signed)
Patient states that she needs refill of citalopram (CELEXA) 40 MG tablet as soon as possible sent to [id number] - [place], [place] - [address]    Also states that her O2 dropped to 85% today for a couple of hours.

## 2021-11-26 MED ORDER — CITALOPRAM HYDROBROMIDE 40 MG PO TABS: 40.0000 mg | ORAL_TABLET | Freq: Every day | ORAL | 3 refills | Status: DC

## 2022-02-12 ENCOUNTER — Ambulatory Visit: Payer: BC Managed Care – PPO | Admitting: Internal Medicine

## 2022-02-15 ENCOUNTER — Ambulatory Visit (INDEPENDENT_AMBULATORY_CARE_PROVIDER_SITE_OTHER): Payer: BC Managed Care – PPO | Admitting: Internal Medicine

## 2022-02-15 VITALS — BP 112/80 | HR 69 | Temp 98.9°F | Ht 64.0 in | Wt 142.0 lb

## 2022-02-15 DIAGNOSIS — F32A Depression, unspecified: Secondary | ICD-10-CM

## 2022-02-15 DIAGNOSIS — E538 Deficiency of other specified B group vitamins: Secondary | ICD-10-CM

## 2022-02-15 DIAGNOSIS — E559 Vitamin D deficiency, unspecified: Secondary | ICD-10-CM

## 2022-02-15 DIAGNOSIS — R1084 Generalized abdominal pain: Secondary | ICD-10-CM

## 2022-02-15 DIAGNOSIS — Z1159 Encounter for screening for other viral diseases: Secondary | ICD-10-CM

## 2022-02-15 DIAGNOSIS — E7800 Pure hypercholesterolemia, unspecified: Secondary | ICD-10-CM | POA: Diagnosis not present

## 2022-02-15 DIAGNOSIS — Z0001 Encounter for general adult medical examination with abnormal findings: Secondary | ICD-10-CM

## 2022-02-15 DIAGNOSIS — E611 Iron deficiency: Secondary | ICD-10-CM | POA: Diagnosis not present

## 2022-02-15 DIAGNOSIS — G4710 Hypersomnia, unspecified: Secondary | ICD-10-CM

## 2022-02-15 LAB — LIPID PANEL
Cholesterol: 171 mg/dL (ref 0–200)
HDL: 50 mg/dL (ref >39.00)
LDL Cholesterol: 101 mg/dL — ABNORMAL HIGH (ref 0–99)
NonHDL: 121.35
Total CHOL/HDL Ratio: 3
Triglycerides: 104 mg/dL (ref 0.0–149.0)
VLDL: 20.8 mg/dL (ref 0.0–40.0)

## 2022-02-15 LAB — HEPATIC FUNCTION PANEL
ALT: 10 U/L (ref 0–35)
AST: 16 U/L (ref 0–37)
Albumin: 4.4 g/dL (ref 3.5–5.2)
Alkaline Phosphatase: 60 U/L (ref 39–117)
Bilirubin, Direct: 0.1 mg/dL (ref 0.0–0.3)
Total Bilirubin: 0.2 mg/dL (ref 0.2–1.2)
Total Protein: 8 g/dL (ref 6.0–8.3)

## 2022-02-15 LAB — CBC WITH DIFFERENTIAL/PLATELET
Basophils Absolute: 0 10*3/uL (ref 0.0–0.1)
Basophils Relative: 0.6 % (ref 0.0–3.0)
Eosinophils Absolute: 0.1 10*3/uL (ref 0.0–0.7)
Eosinophils Relative: 1.9 % (ref 0.0–5.0)
HCT: 40.6 % (ref 36.0–46.0)
Hemoglobin: 13.9 g/dL (ref 12.0–15.0)
Lymphocytes Relative: 35.1 % (ref 12.0–46.0)
Lymphs Abs: 2.2 10*3/uL (ref 0.7–4.0)
MCHC: 34.2 g/dL (ref 30.0–36.0)
MCV: 88.7 fl (ref 78.0–100.0)
Monocytes Absolute: 0.5 10*3/uL (ref 0.1–1.0)
Monocytes Relative: 8.3 % (ref 3.0–12.0)
Neutro Abs: 3.4 10*3/uL (ref 1.4–7.7)
Neutrophils Relative %: 54.1 % (ref 43.0–77.0)
Platelets: 237 10*3/uL (ref 150.0–400.0)
RBC: 4.58 Mil/uL (ref 3.87–5.11)
RDW: 12.9 % (ref 11.5–15.5)
WBC: 6.4 10*3/uL (ref 4.0–10.5)

## 2022-02-15 LAB — IBC PANEL
Iron: 45 ug/dL (ref 42–145)
Saturation Ratios: 12 % — ABNORMAL LOW (ref 20.0–50.0)
TIBC: 373.8 ug/dL (ref 250.0–450.0)
Transferrin: 267 mg/dL (ref 212.0–360.0)

## 2022-02-15 LAB — BASIC METABOLIC PANEL
BUN: 12 mg/dL (ref 6–23)
CO2: 27 mEq/L (ref 19–32)
Calcium: 9.8 mg/dL (ref 8.4–10.5)
Chloride: 101 mEq/L (ref 96–112)
Creatinine, Ser: 0.92 mg/dL (ref 0.40–1.20)
GFR: 87.65 mL/min (ref >60.00)
Glucose, Bld: 85 mg/dL (ref 70–99)
Potassium: 4.4 mEq/L (ref 3.5–5.1)
Sodium: 135 mEq/L (ref 135–145)

## 2022-02-15 LAB — TSH: TSH: 7.13 u[IU]/mL — ABNORMAL HIGH (ref 0.35–5.50)

## 2022-02-15 NOTE — Assessment & Plan Note (Signed)
Marked symptoms - depression?, cant r/o osa though not related to obesity - for pulmonary referral

## 2022-02-15 NOTE — Progress Notes (Signed)
Patient ID: Valerie [name], female   DOB: [birth year], 21 y.o.   MRN: [id number]         Chief Complaint:: wellness exam and Allergic Reaction (Gluten issues. Gets a red ring around her mouth when eating pizza or bread. Pt states that she starting noticing this about 3 weeks ago. )  , fatigue, hypersomnolence, depression       HPI:  Valerie [name] is a 21 y.o. female here for wellness exam; due for pap - plans to call for appt soon, Does c/o ongoing fatigue, but also very severe signficant daytime hypersomnolence.  Denies worsening depressive symptoms, suicidal ideation, or panic; has ongoing anxiety, not increased recently. Denies worsening reflux, dysphagia, n/v, bowel change or blood, but feels terrible now with eating carbs after lost 15 lbs with exercise.     Wt Readings from Last 3 Encounters:  02/15/22 142 lb (64.4 kg)  11/23/21 146 lb (66.2 kg)  08/05/21 147 lb 3.2 oz (66.8 kg)   BP Readings from Last 3 Encounters:  02/15/22 112/80  11/23/21 100/72  10/11/21 122/84   Immunization History  Administered Date(s) Administered   PFIZER(Purple Top)SARS-COV-2 Vaccination 10/22/2019, 02/04/2020   Health Maintenance Due  Topic Date Due   Hepatitis C Screening  Never done   PAP-Cervical Cytology Screening  Never done   PAP SMEAR-Modifier  Never done      Past Medical History:  Diagnosis Date   Allergic rhinitis    Anxiety    Childhood asthma    Depression 05/19/2017   Migraine    Recurrent upper respiratory infection (URI)    No past surgical history on file.  reports that she has never smoked. She has been exposed to tobacco smoke. She has never used smokeless tobacco. She reports current alcohol use. She reports that she does not use drugs. family history includes AAA (abdominal aortic aneurysm) in her maternal grandfather; Alcohol abuse in her maternal grandfather; Arthritis in her maternal grandmother, mother, paternal grandfather, and paternal grandmother; Asthma in  her mother; Bone cancer in her maternal grandmother; COPD in her maternal grandfather; Cancer in her maternal grandfather; Depression in her father, mother, and paternal grandmother; Heart disease in her maternal grandmother; Hyperlipidemia in her mother and paternal grandfather; Hypertension in her paternal grandmother; Stroke in her maternal grandmother. Allergies  Allergen Reactions   Promethazine-Dm Shortness Of Breath   Current Outpatient Medications on File Prior to Visit  Medication Sig Dispense Refill   albuterol (PROVENTIL HFA;VENTOLIN HFA) 108 (90 Base) MCG/ACT inhaler Inhale 2 puffs into the lungs every 6 (six) hours as needed for wheezing or shortness of breath. 18 g 11   Budeson-Glycopyrrol-Formoterol (BREZTRI AEROSPHERE) 160-9-4.8 MCG/ACT AERO Inhale 2 puffs into the lungs in the morning and at bedtime. 10.7 g 5   citalopram (CELEXA) 40 MG tablet Take 1 tablet (40 mg total) by mouth daily. 90 tablet 3   Mepolizumab (NUCALA) 100 MG/ML SOAJ Inject 1 mL (100 mg total) into the skin every 28 (twenty-eight) days. 1 mL 11   Olopatadine-Mometasone (RYALTRIS) 665-25 MCG/ACT SUSP Place 2 sprays into the nose 2 (two) times daily. 29 g 5   rizatriptan (MAXALT) 10 MG tablet TAKE 1 TABLET BY MOUTH AS NEEDED FOR MIGRAINE. MAY REPEAT IN 2 HOURS IF NEEDED 8 tablet 1   Sod Fluoride-Potassium Nitrate 1.1-5 % PSTE PreviDent 5000 Sensitive 1.1 %-5 % dental paste  USE AS DIRECTED 3 TIMES A DAY. DO NOT SWALLOW     Spacer/Aero-Holding Rudean Curt Use as  directed with inhaler. 1 each 0   No current facility-administered medications on file prior to visit.        ROS:  All others reviewed and negative.  Objective        PE:  BP 112/80 (BP Location: Left Arm, Patient Position: Sitting, Cuff Size: Normal)   Pulse 69   Temp 98.9 F (37.2 C) (Oral)   Ht 5\' 4"  (1.626 m)   Wt 142 lb (64.4 kg)   SpO2 99%   BMI 24.37 kg/m                 Constitutional: Pt appears in NAD               HENT: Head:  NCAT.                Right Ear: External ear normal.                 Left Ear: External ear normal.                Eyes: . Pupils are equal, round, and reactive to light. Conjunctivae and EOM are normal               Nose: without d/c or deformity               Neck: Neck supple. Gross normal ROM               Cardiovascular: Normal rate and regular rhythm.                 Pulmonary/Chest: Effort normal and breath sounds without rales or wheezing.                Abd:  Soft, NT, ND, + BS, no organomegaly               Neurological: Pt is alert. At baseline orientation, motor grossly intact               Skin: Skin is warm. No rashes, no other new lesions, LE edema - none               Psychiatric: Pt behavior is normal without agitation , mild depressed affect  Micro: none  Cardiac tracings I have personally interpreted today:  none  Pertinent Radiological findings (summarize): none   Lab Results  Component Value Date   WBC 6.5 03/11/2021   HGB 14.1 03/11/2021   HCT 41.0 03/11/2021   PLT 262 01/23/2020   GLUCOSE 82 01/23/2020   CHOL 134 05/19/2017   TRIG 99.0 05/19/2017   HDL 41.20 05/19/2017   LDLCALC 73 05/19/2017   ALT 10 01/23/2020   AST 14 01/23/2020   NA 137 01/23/2020   K 4.4 01/23/2020   CL 103 01/23/2020   CREATININE 0.88 01/23/2020   BUN 9 01/23/2020   CO2 26 01/23/2020   TSH 4.25 01/23/2020   Assessment/Plan:  Valerie [name] is a 21 y.o. White or Caucasian [1] female with  has a past medical history of Allergic rhinitis, Anxiety, Childhood asthma, Depression (05/19/2017), Migraine, and Recurrent upper respiratory infection (URI).  Encounter for well adult exam with abnormal findings Age and sex appropriate education and counseling updated with regular exercise and diet Referrals for preventative services - pt to call for GYN appt Immunizations addressed - none needed Smoking counseling  - none needed Evidence for depression or other mood disorder - none  significant Most recent labs reviewed. I  have personally reviewed and have noted: 1) the patient's medical and social history 2) The patient's current medications and supplements 3) The patient's height, weight, and BMI have been recorded in the chart   Depression Mild, declines further tx for now or referral  Hypersomnolence Marked symptoms - depression?, cant r/o osa though not related to obesity - for pulmonary referral  Generalized abdominal pain Mild intermittent, exam benign, for labs including ua and cbc, celiac panel per pt request  Followup: No follow-ups on file.  Oliver Barre, MD 02/15/2022 7:40 PM  [facility] Group  [facility] - [facility]

## 2022-02-15 NOTE — Patient Instructions (Signed)
Please remember to make your yearly GYN appt with [facility].   Please continue all other medications as before, and refills have been done if requested.  Please have the pharmacy call with any other refills you may need.  Please continue your efforts at being more active, low cholesterol diet, and weight control.  You are otherwise up to date with prevention measures today.  Please keep your appointments with your specialists as you may have planned  You will be contacted regarding the referral for: pulmonary for possible sleep apnea  Please go to the LAB at the blood drawing area for the tests to be done  You will be contacted by phone if any changes need to be made immediately.  Otherwise, you will receive a letter about your results with an explanation, but please check with MyChart first.  Please remember to sign up for MyChart if you have not done so, as this will be important to you in the future with finding out test results, communicating by private email, and scheduling acute appointments online when needed.  Please make an Appointment to return for your 1 year visit, or sooner if needed

## 2022-02-15 NOTE — Assessment & Plan Note (Signed)
Mild intermittent, exam benign, for labs including ua and cbc, celiac panel per pt request

## 2022-02-15 NOTE — Assessment & Plan Note (Signed)
Age and sex appropriate education and counseling updated with regular exercise and diet Referrals for preventative services - pt to call for GYN appt Immunizations addressed - none needed Smoking counseling  - none needed Evidence for depression or other mood disorder - none significant Most recent labs reviewed. I have personally reviewed and have noted: 1) the patient's medical and social history 2) The patient's current medications and supplements 3) The patient's height, weight, and BMI have been recorded in the chart

## 2022-02-15 NOTE — Assessment & Plan Note (Addendum)
Mild, declines further tx for now or referral

## 2022-02-16 ENCOUNTER — Other Ambulatory Visit: Payer: Self-pay | Admitting: Internal Medicine

## 2022-02-16 DIAGNOSIS — E039 Hypothyroidism, unspecified: Secondary | ICD-10-CM

## 2022-02-16 LAB — VITAMIN B12: Vitamin B-12: 446 pg/mL (ref 211–911)

## 2022-02-16 LAB — URINALYSIS, ROUTINE W REFLEX MICROSCOPIC
Bilirubin Urine: NEGATIVE
Hgb urine dipstick: NEGATIVE
Ketones, ur: NEGATIVE
Leukocytes,Ua: NEGATIVE
Nitrite: NEGATIVE
Specific Gravity, Urine: 1.025 (ref 1.000–1.030)
Total Protein, Urine: NEGATIVE
Urine Glucose: NEGATIVE
Urobilinogen, UA: 0.2 (ref 0.0–1.0)
WBC, UA: NONE SEEN (ref 0–?)
pH: 6 (ref 5.0–8.0)

## 2022-02-16 LAB — GLIADIN ANTIBODIES, SERUM
Gliadin IgA: <1 U/mL
Gliadin IgG: <1 U/mL

## 2022-02-16 LAB — HEPATITIS C ANTIBODY: Hepatitis C Ab: NONREACTIVE

## 2022-02-16 LAB — FERRITIN: Ferritin: 11.7 ng/mL (ref 10.0–291.0)

## 2022-02-16 LAB — VITAMIN D 25 HYDROXY (VIT D DEFICIENCY, FRACTURES): VITD: 45.3 ng/mL (ref 30.00–100.00)

## 2022-02-16 LAB — TISSUE TRANSGLUTAMINASE, IGA: (tTG) Ab, IgA: <1 U/mL

## 2022-02-16 MED ORDER — LEVOTHYROXINE SODIUM 25 MCG PO TABS: 25.0000 ug | ORAL_TABLET | Freq: Every day | ORAL | 3 refills | Status: DC

## 2022-02-17 LAB — RETICULIN ANTIBODIES, IGA W TITER: Reticulin Ab, IgA: NEGATIVE titer (ref <2.5)

## 2022-02-22 ENCOUNTER — Ambulatory Visit (INDEPENDENT_AMBULATORY_CARE_PROVIDER_SITE_OTHER): Payer: BC Managed Care – PPO | Admitting: Primary Care

## 2022-02-22 ENCOUNTER — Encounter: Payer: Self-pay | Admitting: Primary Care

## 2022-02-22 VITALS — BP 110/78 | HR 89 | Temp 99.0°F | Ht 64.5 in | Wt 141.2 lb

## 2022-02-22 DIAGNOSIS — G4710 Hypersomnia, unspecified: Secondary | ICD-10-CM | POA: Diagnosis not present

## 2022-02-22 DIAGNOSIS — R0681 Apnea, not elsewhere classified: Secondary | ICD-10-CM | POA: Insufficient documentation

## 2022-02-22 DIAGNOSIS — F32A Depression, unspecified: Secondary | ICD-10-CM | POA: Diagnosis not present

## 2022-02-22 DIAGNOSIS — G4730 Sleep apnea, unspecified: Secondary | ICD-10-CM | POA: Insufficient documentation

## 2022-02-22 DIAGNOSIS — J4530 Mild persistent asthma, uncomplicated: Secondary | ICD-10-CM

## 2022-02-22 DIAGNOSIS — G4719 Other hypersomnia: Secondary | ICD-10-CM

## 2022-02-22 NOTE — Assessment & Plan Note (Addendum)
-   Feels her asthma symptoms are well controlled. She uses SABA 2-3 times a week mainly at work - [employer], prn Albuterol and Nucala  - Following with Dr. Nelva Bush with Asthma and allergy

## 2022-02-22 NOTE — Progress Notes (Signed)
Reviewed and agree with assessment/plan.   Chesley Mires, MD [facility] 02/22/2022, 12:48 PM Pager:  [phone]

## 2022-02-22 NOTE — Patient Instructions (Addendum)
  Sleep apnea is defined as period of 10 seconds or longer when you stop breathing at night. This can happen multiple times a night. Dx sleep apnea is when this occurs more than 5 times an hour.    Mild OSA 5-15 apneic events an hour Moderate OSA 15-30 apneic events an hour Severe OSA > 30 apneic events an hour   Untreated sleep apnea puts you at higher risk for cardiac arrhythmias, pulmonary HTN, stroke and diabetes   Treatment options include weight loss, side sleeping position, oral appliance, CPAP therapy or referral to ENT for possible surgical options   If sleep study is negative we may want to get in-lab study to rule out narcolepsy    Recommendations: Focus on side sleeping position or elevate head with wedge pillow 30 degrees Do not drive if experiencing excessive daytime sleepiness of fatigue  Avoid alcohol or sedating medication prior to bedtime    Orders: Home sleep study re: witnessed apnea/excessive daytime sleepiness    Follow-up: Please call to schedule follow-up 1-2 weeks after completing home sleep study to review results and treatment if needed (can be virtual)

## 2022-02-22 NOTE — Assessment & Plan Note (Addendum)
-   Hypothyroidism and depression could be contributing to hypersomnolence, as well as shift work sleep disorder - No overt symptoms of narcolepsy at this time but may want to be considered if HST is negative  - Encourage consistent bed/wake time, getting 6-8 hours of sleep a night and limiting naps 1-2 times a day 30-27mins

## 2022-02-22 NOTE — Progress Notes (Signed)
@Patient  ID: Valerie [name], female    DOB: [birth year], 21 y.o.   MRN: [id number]  No chief complaint on file.   Referring provider: Biagio Borg, MD  HPI: 21 year old female, never smoked.  Past medical history significant for asthma, allergic rhinitis, chronic sinusitis, migraine headaches, depression/anxiety.  02/22/2022 Patient presents today for sleep consult due to hypersomnolence. Concern for sleep apnea. She has symptoms of snoring, witnessed apnea and daytime sleepiness. She has been told that she stops breathing in her sleep. She will dream that she is suffocating or drowning in her sleep. She is tired all the time. She never feels rested.  Works at [employer] and sleep schedule often very due to this.  She goes to sleep anywhere between 11 PM and 3 AM.  She will start her day between 4 AM and 12 PM. She naps a lot during the day d/t exhaustion. Naps will last 4 hours. She was recently hx with hypothyroidism. Her mother has sleep apnea, agoraphobia and narcolepsy.   She is on Breztri, albuterol and Nucala for asthma.  Follows with Dr. Nelva Bush with asthma and allergy. She was referred to ENT back in July 2023 d/t chronic sinusitis but cancelled apt d/t illness. She last saw them back during covid.  Sleep questionnaire Symptoms- Feels suffocated, trouble breathing, cant stay asleep, never feels rested, waking up gasping for air Prior sleep study- None  Bedtime- 11pm - 3am  Time to fall asleep- Not long Nocturnal awakenings- every hours  Out of bed/start of day- 4-12pm  Weight changes- 15 lbs down  Do you operate heavy machinery- No Do you currently wear CPAP- No Do you current wear oxygen- No Epworth- 12 Medications- Citalopram 40mg  daily   Allergies  Allergen Reactions   Promethazine-Dm Shortness Of Breath    Immunization History  Administered Date(s) Administered   PFIZER(Purple Top)SARS-COV-2 Vaccination 10/22/2019, 02/04/2020    Past Medical History:   Diagnosis Date   Allergic rhinitis    Anxiety    Childhood asthma    Depression 05/19/2017   Migraine    Recurrent upper respiratory infection (URI)     Tobacco History: Social History   Tobacco Use  Smoking Status Former   Types: E-cigarettes   Passive exposure: Yes  Smokeless Tobacco Never  Tobacco Comments   Tried vaping in high school for a short period of time   Counseling given: Not Answered Tobacco comments: Tried vaping in high school for a short period of time   Outpatient Medications Prior to Visit  Medication Sig Dispense Refill   albuterol (PROVENTIL HFA;VENTOLIN HFA) 108 (90 Base) MCG/ACT inhaler Inhale 2 puffs into the lungs every 6 (six) hours as needed for wheezing or shortness of breath. 18 g 11   Budeson-Glycopyrrol-Formoterol (BREZTRI AEROSPHERE) 160-9-4.8 MCG/ACT AERO Inhale 2 puffs into the lungs in the morning and at bedtime. 10.7 g 5   citalopram (CELEXA) 40 MG tablet Take 1 tablet (40 mg total) by mouth daily. 90 tablet 3   levothyroxine (SYNTHROID) 25 MCG tablet Take 1 tablet (25 mcg total) by mouth daily. 90 tablet 3   Mepolizumab (NUCALA) 100 MG/ML SOAJ Inject 1 mL (100 mg total) into the skin every 28 (twenty-eight) days. 1 mL 11   Olopatadine-Mometasone (RYALTRIS) 665-25 MCG/ACT SUSP Place 2 sprays into the nose 2 (two) times daily. 29 g 5   rizatriptan (MAXALT) 10 MG tablet TAKE 1 TABLET BY MOUTH AS NEEDED FOR MIGRAINE. MAY REPEAT IN 2 HOURS IF NEEDED 8 tablet 1  Spacer/Aero-Holding Rudean Curt Use as directed with inhaler. 1 each 0   Sod Fluoride-Potassium Nitrate 1.1-5 % PSTE PreviDent 5000 Sensitive 1.1 %-5 % dental paste  USE AS DIRECTED 3 TIMES A DAY. DO NOT SWALLOW     No facility-administered medications prior to visit.   Review of Systems  Review of Systems  Constitutional:  Positive for fatigue.  HENT: Negative.    Respiratory: Negative.    Psychiatric/Behavioral:  Positive for sleep disturbance.     Physical Exam  BP  110/78 (BP Location: Left Arm, Patient Position: Sitting, Cuff Size: Normal)   Pulse 89   Temp 99 F (37.2 C) (Oral)   Ht 5' 4.5" (1.638 m)   Wt 141 lb 3.2 oz (64 kg)   SpO2 98%   BMI 23.86 kg/m  Physical Exam Constitutional:      Appearance: Normal appearance.  HENT:     Head: Normocephalic and atraumatic.     Mouth/Throat:     Mouth: Mucous membranes are moist.     Pharynx: Oropharynx is clear.  Cardiovascular:     Rate and Rhythm: Normal rate and regular rhythm.  Pulmonary:     Effort: Pulmonary effort is normal.     Breath sounds: Normal breath sounds.  Skin:    General: Skin is warm and dry.  Neurological:     General: No focal deficit present.     Mental Status: She is alert and oriented to person, place, and time. Mental status is at baseline.  Psychiatric:        Mood and Affect: Mood normal.        Behavior: Behavior normal.        Thought Content: Thought content normal.        Judgment: Judgment normal.      Lab Results:  CBC    Component Value Date/Time   WBC 6.4 02/15/2022 1532   RBC 4.58 02/15/2022 1532   HGB 13.9 02/15/2022 1532   HGB 14.1 03/11/2021 1216   HCT 40.6 02/15/2022 1532   HCT 41.0 03/11/2021 1216   PLT 237.0 02/15/2022 1532   MCV 88.7 02/15/2022 1532   MCV 88 03/11/2021 1216   MCH 30.3 03/11/2021 1216   MCH 31.1 01/23/2020 1524   MCHC 34.2 02/15/2022 1532   RDW 12.9 02/15/2022 1532   RDW 11.9 03/11/2021 1216   LYMPHSABS 2.2 02/15/2022 1532   LYMPHSABS 2.5 03/11/2021 1216   MONOABS 0.5 02/15/2022 1532   EOSABS 0.1 02/15/2022 1532   EOSABS 0.6 (H) 03/11/2021 1216   BASOSABS 0.0 02/15/2022 1532   BASOSABS 0.1 03/11/2021 1216    BMET    Component Value Date/Time   NA 135 02/15/2022 1532   K 4.4 02/15/2022 1532   CL 101 02/15/2022 1532   CO2 27 02/15/2022 1532   GLUCOSE 85 02/15/2022 1532   BUN 12 02/15/2022 1532   CREATININE 0.92 02/15/2022 1532   CREATININE 0.88 01/23/2020 1524   CALCIUM 9.8 02/15/2022 1532   GFRNONAA  94 01/23/2020 1524   GFRAA 109 01/23/2020 1524    BNP No results found for: "BNP"  ProBNP No results found for: "PROBNP"  Imaging: No results found.   Assessment & Plan:   Witnessed episode of apnea - Patient has symptoms of snoring, witnessed apnea and excessive daytime sleepiness. She has associated chronic sinusitis. Epworth score 12. Concern patient could have underlying obstructive sleep apnea. Needs home sleep study to evaluate.  Reviewed risks of untreated sleep apnea including cardiac arrhythmias, pulmonary  hypertension, stroke and diabetes.  We discussed treatment options including maintaining normal BMI, oral appliance, CPAP therapy or referral to ENT for possible surgical options.  If sleep study is negative would potentially consider in lab sleep study followed by MSLT to rule out narcolepsy. FU 1 to 2 weeks after sleep study to review results and treatment options if needed  Hypersomnolence - Hypothyroidism and depression could be contributing to hypersomnolence, as well as shift work sleep disorder - No overt symptoms of narcolepsy at this time but may want to be considered if HST is negative  - Encourage consistent bed/wake time, getting 6-8 hours of sleep a night and limiting naps 1-2 times a day 30-52mins   Depression - Continue citalopram 40mg  daily   Asthma - Feels her asthma symptoms are well controlled. She uses SABA 2-3 times a week mainly at work - Continue , prn Albuterol and Nucala  - Following with Dr. [employer] with Asthma and allergy   Delorse Lek, NP 02/22/2022

## 2022-02-22 NOTE — Assessment & Plan Note (Signed)
Continue citalopram 40 mg daily

## 2022-02-22 NOTE — Assessment & Plan Note (Signed)
-   Patient has symptoms of snoring, witnessed apnea and excessive daytime sleepiness. She has associated chronic sinusitis. Epworth score 12. Concern patient could have underlying obstructive sleep apnea. Needs home sleep study to evaluate.  Reviewed risks of untreated sleep apnea including cardiac arrhythmias, pulmonary hypertension, stroke and diabetes.  We discussed treatment options including maintaining normal BMI, oral appliance, CPAP therapy or referral to ENT for possible surgical options.  If sleep study is negative would potentially consider in lab sleep study followed by MSLT to rule out narcolepsy. FU 1 to 2 weeks after sleep study to review results and treatment options if needed

## 2022-02-23 ENCOUNTER — Other Ambulatory Visit: Payer: Self-pay | Admitting: Internal Medicine

## 2022-03-11 ENCOUNTER — Ambulatory Visit: Payer: BC Managed Care – PPO | Admitting: Allergy

## 2022-03-11 DIAGNOSIS — J309 Allergic rhinitis, unspecified: Secondary | ICD-10-CM

## 2022-04-14 ENCOUNTER — Encounter: Payer: Self-pay | Admitting: Allergy

## 2022-04-14 ENCOUNTER — Other Ambulatory Visit: Payer: Self-pay

## 2022-04-14 ENCOUNTER — Encounter: Payer: Self-pay | Admitting: Emergency Medicine

## 2022-04-14 ENCOUNTER — Telehealth (INDEPENDENT_AMBULATORY_CARE_PROVIDER_SITE_OTHER): Payer: BC Managed Care – PPO | Admitting: Emergency Medicine

## 2022-04-14 ENCOUNTER — Ambulatory Visit (INDEPENDENT_AMBULATORY_CARE_PROVIDER_SITE_OTHER): Payer: BC Managed Care – PPO | Admitting: Allergy

## 2022-04-14 VITALS — BP 128/86 | HR 89 | Temp 98.6°F | Resp 14 | Ht 64.0 in | Wt 143.6 lb

## 2022-04-14 DIAGNOSIS — J45998 Other asthma: Secondary | ICD-10-CM | POA: Diagnosis not present

## 2022-04-14 DIAGNOSIS — J4550 Severe persistent asthma, uncomplicated: Secondary | ICD-10-CM

## 2022-04-14 DIAGNOSIS — R051 Acute cough: Secondary | ICD-10-CM | POA: Diagnosis not present

## 2022-04-14 DIAGNOSIS — J069 Acute upper respiratory infection, unspecified: Secondary | ICD-10-CM

## 2022-04-14 DIAGNOSIS — J310 Chronic rhinitis: Secondary | ICD-10-CM

## 2022-04-14 DIAGNOSIS — J22 Unspecified acute lower respiratory infection: Secondary | ICD-10-CM | POA: Diagnosis not present

## 2022-04-14 DIAGNOSIS — J4551 Severe persistent asthma with (acute) exacerbation: Secondary | ICD-10-CM

## 2022-04-14 MED ORDER — ALBUTEROL SULFATE HFA 108 (90 BASE) MCG/ACT IN AERS: 2.0000 | INHALATION_SPRAY | Freq: Four times a day (QID) | RESPIRATORY_TRACT | 1 refills | Status: DC | PRN

## 2022-04-14 MED ORDER — HYDROCODONE BIT-HOMATROP MBR 5-1.5 MG/5ML PO SOLN: 5.0000 mL | Freq: Every evening | ORAL | 0 refills | Status: DC | PRN

## 2022-04-14 MED ORDER — RYALTRIS 665-25 MCG/ACT NA SUSP: 2.0000 | Freq: Two times a day (BID) | NASAL | 5 refills | Status: DC

## 2022-04-14 MED ORDER — CETIRIZINE HCL 10 MG PO TABS: 10.0000 mg | ORAL_TABLET | Freq: Every day | ORAL | 5 refills | Status: DC

## 2022-04-14 MED ORDER — AZITHROMYCIN 250 MG PO TABS: ORAL_TABLET | ORAL | 0 refills | Status: DC

## 2022-04-14 MED ORDER — MEPOLIZUMAB 100 MG ~~LOC~~ SOLR
100.0000 mg | SUBCUTANEOUS | Status: AC
  Administered 2022-04-14: 100 mg via SUBCUTANEOUS

## 2022-04-14 MED ORDER — BREZTRI AEROSPHERE 160-9-4.8 MCG/ACT IN AERO: 2.0000 | INHALATION_SPRAY | Freq: Two times a day (BID) | RESPIRATORY_TRACT | 5 refills | Status: DC

## 2022-04-14 MED ORDER — BENZONATATE 200 MG PO CAPS: 200.0000 mg | ORAL_CAPSULE | Freq: Two times a day (BID) | ORAL | 0 refills | Status: DC | PRN

## 2022-04-14 NOTE — Progress Notes (Signed)
Immunotherapy   Patient Details  Name: Valerie [name]. Scriven MRN: [id number] Date of Birth: April 04, 2001  04/14/2022  [name]  Restarted Nucala 100mg /mL Frequency: Every 4 weeks    Due to insurance issue patient has been off of Nucala injections for months. Patient was provided a sample of Nucala 100mg /mL. Patient usually injects her own abdomen with the Nucala pen. Patient injected the right abdomen at her 04/14/22 visit.   04/14/2022, 4:57 PM

## 2022-04-14 NOTE — Patient Instructions (Addendum)
Asthma with exacerbation due to upper respiratory illness -Currently with upper respiratory illness that is flaring asthma -Currently off Nucala for past 2 months related to insurance change of pharmacy.  Will notify Tammy to continue with approval process so you can resume monthly self-administration.  Sample provided today to bridge until your supply is available -Continue Breztri 2 puffs twice a day with spacer to help prevent cough and wheeze.  Rinse mouth after use.  Use Breztri with spacer device.  -Take antibiotic and cough medications as recommended by your PCP   Asthma control goals:  Full participation in all desired activities (may need albuterol before activity) Albuterol use two time or less a week on average (not counting use with activity) Cough interfering with sleep two time or less a month Oral steroids no more than once a year No hospitalizations  Rhinitis, nonallergic   - environmental allergy testing has been negative by skin testing and serum IgE testing   - continue taking daily antihistamine like Cetirizine 10mg  daily    - for nasal congestion/drainage continue Ryaltris 2 sprays each nostril twice a day as needed.    Follow-up in 3-4 months or sooner if needed

## 2022-04-14 NOTE — Progress Notes (Signed)
Telemedicine Encounter- SOAP NOTE Established Patient MyChart video conference Patient: Home  Provider: Office   Patient present only  This video encounter was conducted with the patient's (or proxy's) verbal consent via audio telecommunications: yes/no: Yes Patient was instructed to have this encounter in a suitably private space; and to only have persons present to whom they give permission to participate. In addition, patient identity was confirmed by use of name plus two identifiers (DOB and address).  I discussed the limitations, risks, security and privacy concerns of performing an evaluation and management service by telephone and the availability of in person appointments. I also discussed with the patient that there may be a patient responsible charge related to this service. The patient expressed understanding and agreed to proceed.  I spent a total of TIME; 0 MIN TO 60 MIN: 25 minutes talking with the patient or their proxy.  Chief complaint: Cough and congestion  Subjective   Valerie [name] is a 21 y.o. female established patient. Telephone visit today complaining of cough and congestion that started 1 week ago and is progressively getting worse.  History of asthma.  Denies fever or chills.  Occasional wheezing and difficulty breathing.  Cough productive of yellow phlegm.  Denies chest pain.  Able to eat and drink.  Denies nausea or vomiting.  Denies abdominal pain or diarrhea.  No other associated symptoms. No other complaints or medical concerns today.  HPI   Patient Active Problem List   Diagnosis Date Noted   Hypersomnolence 02/15/2022   Sinusitis 12/20/2020   Chronic sinusitis 04/28/2019   Insomnia 11/13/2018   Deviated nasal septum 11/13/2018   Asthma 12/27/2017   Depression 05/19/2017   Mild intermittent asthma    Allergic rhinitis    Migraine    Anxiety     Past Medical History:  Diagnosis Date   Allergic rhinitis    Anxiety    Childhood asthma     Depression 05/19/2017   Migraine    Recurrent upper respiratory infection (URI)     Current Outpatient Medications  Medication Sig Dispense Refill   albuterol (PROVENTIL HFA;VENTOLIN HFA) 108 (90 Base) MCG/ACT inhaler Inhale 2 puffs into the lungs every 6 (six) hours as needed for wheezing or shortness of breath. 18 g 11   Budeson-Glycopyrrol-Formoterol (BREZTRI AEROSPHERE) 160-9-4.8 MCG/ACT AERO Inhale 2 puffs into the lungs in the morning and at bedtime. 10.7 g 5   citalopram (CELEXA) 40 MG tablet Take 1 tablet (40 mg total) by mouth daily. 90 tablet 3   levothyroxine (SYNTHROID) 25 MCG tablet Take 1 tablet (25 mcg total) by mouth daily. 90 tablet 3   Mepolizumab (NUCALA) 100 MG/ML SOAJ Inject 1 mL (100 mg total) into the skin every 28 (twenty-eight) days. 1 mL 11   Olopatadine-Mometasone (RYALTRIS) 665-25 MCG/ACT SUSP Place 2 sprays into the nose 2 (two) times daily. 29 g 5   rizatriptan (MAXALT) 10 MG tablet TAKE 1 TABLET BY MOUTH AS NEEDED FOR MIGRAINE. MAY REPEAT IN 2 HOURS IF NEEDED 8 tablet 1   Spacer/Aero-Holding Chambers DEVI Use as directed with inhaler. 1 each 0   No current facility-administered medications for this visit.    Allergies  Allergen Reactions   Promethazine-Dm Shortness Of Breath    Social History   Socioeconomic History   Marital status: Single    Spouse name: Not on file   Number of children: Not on file   Years of education: Not on file   Highest education level: Not  on file  Occupational History   Not on file  Tobacco Use   Smoking status: Former    Types: E-cigarettes    Passive exposure: Yes   Smokeless tobacco: Never   Tobacco comments:    Tried vaping in high school for a short period of time  Vaping Use   Vaping Use: Former   Devices: 3 years ago, for about 2 months and then she quit  Substance and Sexual Activity   Alcohol use: Yes    Comment: 0cc   Drug use: No   Sexual activity: Never    Birth control/protection: None  Other  Topics Concern   Not on file  Social History Narrative   Not on file   Social Determinants of Health   Financial Resource Strain: Not on file  Food Insecurity: Not on file  Transportation Needs: Not on file  Physical Activity: Not on file  Stress: Not on file  Social Connections: Not on file  Intimate Partner Violence: Not on file    Review of Systems  Constitutional: Negative.  Negative for chills and fever.  HENT:  Positive for congestion.   Respiratory:  Positive for cough and sputum production. Negative for shortness of breath.   Cardiovascular: Negative.  Negative for chest pain and palpitations.  Gastrointestinal:  Negative for abdominal pain, nausea and vomiting.  Skin: Negative.  Negative for rash.  Neurological: Negative.  Negative for dizziness and headaches.  All other systems reviewed and are negative.   Objective  Alert and oriented x 3 in no apparent respiratory distress Vitals as reported by the patient: There were no vitals filed for this visit. Problem List Items Addressed This Visit   None Visit Diagnoses     Lower respiratory infection    -  Primary   Relevant Medications   azithromycin (ZITHROMAX) 250 MG tablet   Acute cough       Relevant Medications   benzonatate (TESSALON) 200 MG capsule   HYDROcodone bit-homatropine (HYCODAN) 5-1.5 MG/5ML syrup        Clinically stable.  No red flag signs or symptoms.  Starting to develop secondary bacterial infection.  Will benefit from antibiotics. Cough management discussed.  Continue over-the-counter Mucinex DM and start Tessalon, Hycodan at bedtime Advised to rest and stay well-hydrated. Advised to contact the office if no better or worse during the next several days.  I discussed the assessment and treatment plan with the patient. The patient was provided an opportunity to ask questions and all were answered. The patient agreed with the plan and demonstrated an understanding of the instructions.    The patient was advised to call back or seek an in-person evaluation if the symptoms worsen or if the condition fails to improve as anticipated.  I provided 25 minutes of non-face-to-face time during this encounter.  Georgina Quint, MD  Primary Care at [facility]

## 2022-04-14 NOTE — Progress Notes (Signed)
Follow-up Note  RE: Valerie [name]. Valerie [name]: [id number] DOB: 04-28-2001 Date of Office Visit: 04/14/2022   History of present illness: Valerie [name] is a 21 y.o. female presenting today for follow-up of asthma, nonallergic rhinitis and history of recurrent sinus infections.  Last visit was a telemedicine visit on 07/27/2021 by our nurse practitioner Amada Jupiter.  She states after last visit she did restart the [place] after March.  She self administers at home once a month.  She denies any issues with the administration process.  However 2 months ago her insurance changed who she would be getting the Nucala through.  Thus she has been off of it for past 2 months.  Per our biologics coordinator her insurance changed and they were unable to sure about the medication and she did not have appointment with reapproval.  She states she can tell a difference off the Nucala and states her coworkers are noticing she is having more attacks at work and shortness of breath.  She states she currently has a URI with increased coughing and shortness of breath, general fatigue, congestion, no fever and had a televisit with her PCP this morning and was prescribed antibiotic (azithromycin) and also was prescribed tessalon perls and cough syrup.  She tries to avoid prednisone due to potential side effects.   She continues on breztri 2 puffs twice a day.  She did stop singulair due to issues with depression.  She restarted zyrtec with the season change about a month ago.  She also states she likes the Ryaltris nasal spray but was not able to get it from the specialty pharmacy for some reason.  Review of systems: Review of Systems  Constitutional:  Positive for fatigue.  HENT: Negative.    Eyes: Negative.   Respiratory:  Positive for cough and shortness of breath.   Cardiovascular: Negative.   Gastrointestinal: Negative.   Musculoskeletal: Negative.   Skin: Negative.   Allergic/Immunologic: Negative.    Neurological: Negative.      All other systems negative unless noted above in HPI  Past medical/social/surgical/family history have been reviewed and are unchanged unless specifically indicated below.  No changes  Medication List: Current Outpatient Medications  Medication Sig Dispense Refill   albuterol (PROVENTIL HFA;VENTOLIN HFA) 108 (90 Base) MCG/ACT inhaler Inhale 2 puffs into the lungs every 6 (six) hours as needed for wheezing or shortness of breath. 18 g 11   azithromycin (ZITHROMAX) 250 MG tablet Sig as indicated 6 tablet 0   benzonatate (TESSALON) 200 MG capsule Take 1 capsule (200 mg total) by mouth 2 (two) times daily as needed for cough. 20 capsule 0   Budeson-Glycopyrrol-Formoterol (BREZTRI AEROSPHERE) 160-9-4.8 MCG/ACT AERO Inhale 2 puffs into the lungs in the morning and at bedtime. 10.7 g 5   citalopram (CELEXA) 40 MG tablet Take 1 tablet (40 mg total) by mouth daily. 90 tablet 3   HYDROcodone bit-homatropine (HYCODAN) 5-1.5 MG/5ML syrup Take 5 mLs by mouth at bedtime as needed for cough. 120 mL 0   levothyroxine (SYNTHROID) 25 MCG tablet Take 1 tablet (25 mcg total) by mouth daily. 90 tablet 3   levothyroxine (SYNTHROID) 25 MCG tablet Take 1 tablet by mouth daily.     Mepolizumab (NUCALA) 100 MG/ML SOAJ Inject 1 mL (100 mg total) into the skin every 28 (twenty-eight) days. 1 mL 11   Olopatadine-Mometasone (RYALTRIS) 665-25 MCG/ACT SUSP Place 2 sprays into the nose 2 (two) times daily. 29 g 5   rizatriptan (MAXALT) 10 MG  tablet TAKE 1 TABLET BY MOUTH AS NEEDED FOR MIGRAINE. MAY REPEAT IN 2 HOURS IF NEEDED 8 tablet 1   Spacer/Aero-Holding Chambers DEVI Use as directed with inhaler. 1 each 0   Current Facility-Administered Medications  Medication Dose Route Frequency Provider Last Rate Last Admin   mepolizumab (NUCALA) injection 100 mg  100 mg Subcutaneous Q28 days Marcelyn Bruins, MD   100 mg at 04/14/22 1711     Known medication allergies: Allergies   Allergen Reactions   Promethazine-Dm Shortness Of Breath     Physical examination: Blood pressure 128/86, pulse 89, temperature 98.6 F (37 C), resp. rate 14, height 5\' 4"  (1.626 m), weight 143 lb 9.6 oz (65.1 kg), SpO2 98 %.  General: Alert, interactive, in no acute distress. HEENT: PERRLA, TMs pearly gray, turbinates moderately edematous without discharge, post-pharynx non erythematous. Neck: Supple without lymphadenopathy. Lungs: Clear to auscultation without wheezing, rhonchi or rales. {no increased work of breathing. CV: Normal S1, S2 without murmurs. Abdomen: Nondistended, nontender. Skin: Warm and dry, without lesions or rashes. Extremities:  No clubbing, cyanosis or edema. Neuro:   Grossly intact.  Diagnositics/Labs: None today  Assessment and plan:   Asthma with exacerbation due to upper respiratory illness -Currently with upper respiratory illness that is flaring asthma -Currently off Nucala for past 2 months related to insurance change of pharmacy.  Will notify Valerie [name] to continue with approval process so you can resume monthly self-administration.  Sample provided today to bridge until your supply is available.  -Asthma symptoms have not been controlled over the past 2 months of Nucala -Continue Breztri 2 puffs twice a day with spacer to help prevent cough and wheeze.  Rinse mouth after use.  Use Breztri with spacer device.  -Singulair discontinued due to side effect -Take antibiotic and cough medications as recommended by your PCP   Asthma control goals:  Full participation in all desired activities (may need albuterol before activity) Albuterol use two time or less a week on average (not counting use with activity) Cough interfering with sleep two time or less a month Oral steroids no more than once a year No hospitalizations  Rhinitis, nonallergic   - environmental allergy testing has been negative by skin testing and serum IgE testing   - continue taking daily  antihistamine like Cetirizine 10mg  daily    - for nasal congestion/drainage continue Ryaltris 2 sprays each nostril twice a day as needed.    Follow-up in 3-4 months or sooner if needed  I appreciate the opportunity to take part in [place] care. Please do not hesitate to contact me with questions.  Sincerely,   , MD Allergy/Immunology [facility]

## 2022-04-23 DIAGNOSIS — Z419 Encounter for procedure for purposes other than remedying health state, unspecified: Secondary | ICD-10-CM | POA: Diagnosis not present

## 2022-04-30 ENCOUNTER — Other Ambulatory Visit (HOSPITAL_COMMUNITY): Payer: Self-pay

## 2022-04-30 ENCOUNTER — Telehealth: Payer: Self-pay | Admitting: *Deleted

## 2022-04-30 MED ORDER — NUCALA 100 MG/ML ~~LOC~~ SOAJ
100.0000 mg | SUBCUTANEOUS | 11 refills | Status: DC
  Filled 2022-04-30 – 2022-05-06 (×3): qty 1, 28d supply, fill #0
  Filled 2022-06-09: qty 1, 28d supply, fill #1
  Filled 2022-07-29: qty 1, 28d supply, fill #2
  Filled 2022-09-02: qty 1, 28d supply, fill #3
  Filled 2022-10-04: qty 1, 28d supply, fill #4
  Filled 2022-10-27: qty 1, 28d supply, fill #5
  Filled 2022-11-26 – 2022-12-02 (×3): qty 1, 28d supply, fill #6
  Filled 2023-03-11: qty 1, 28d supply, fill #7
  Filled 2023-04-01: qty 1, 28d supply, fill #8

## 2022-04-30 NOTE — Telephone Encounter (Signed)
Called patient to advise change in pharmacy from [facility] to [facility] per Ins

## 2022-05-06 ENCOUNTER — Other Ambulatory Visit (HOSPITAL_COMMUNITY): Payer: Self-pay

## 2022-05-07 ENCOUNTER — Other Ambulatory Visit (HOSPITAL_COMMUNITY): Payer: Self-pay

## 2022-05-07 ENCOUNTER — Other Ambulatory Visit: Payer: Self-pay

## 2022-05-21 ENCOUNTER — Other Ambulatory Visit (HOSPITAL_COMMUNITY): Payer: Self-pay

## 2022-05-24 DIAGNOSIS — Z419 Encounter for procedure for purposes other than remedying health state, unspecified: Secondary | ICD-10-CM | POA: Diagnosis not present

## 2022-05-25 ENCOUNTER — Telehealth (INDEPENDENT_AMBULATORY_CARE_PROVIDER_SITE_OTHER): Payer: BC Managed Care – PPO | Admitting: Internal Medicine

## 2022-05-25 ENCOUNTER — Encounter: Payer: Self-pay | Admitting: Internal Medicine

## 2022-05-25 DIAGNOSIS — J988 Other specified respiratory disorders: Secondary | ICD-10-CM

## 2022-05-25 DIAGNOSIS — J4520 Mild intermittent asthma, uncomplicated: Secondary | ICD-10-CM

## 2022-05-25 DIAGNOSIS — G8929 Other chronic pain: Secondary | ICD-10-CM

## 2022-05-25 DIAGNOSIS — M545 Low back pain, unspecified: Secondary | ICD-10-CM | POA: Insufficient documentation

## 2022-05-25 MED ORDER — CYCLOBENZAPRINE HCL 5 MG PO TABS: 5.0000 mg | ORAL_TABLET | Freq: Three times a day (TID) | ORAL | 1 refills | Status: AC | PRN

## 2022-05-25 NOTE — Patient Instructions (Signed)
Please take all new medication as prescribed 

## 2022-05-25 NOTE — Assessment & Plan Note (Signed)
Currently improving again but still fatigued, has time away from work for the next wk, also for letter of accomodation to limit shifts to 6 hrs

## 2022-05-25 NOTE — Assessment & Plan Note (Addendum)
Chronic recurring for many years every 1-2 wks, treated with otc nsaid and tylenol, ok for trial flexeril 5 mg tid prn, delcines xray for now to r/o other such as anklosing spondylitis

## 2022-05-25 NOTE — Assessment & Plan Note (Signed)
Currently stable, cont inhaler prn 

## 2022-05-25 NOTE — Progress Notes (Signed)
Patient ID: Valerie [name]. Valerie [name], female   DOB: 09-09-2000, 22 y.o.   MRN: [id number]  Virtual Visit via Video Note  I connected with Valerie [name] on 05/25/22 at 10:40 AM EST by a video enabled telemedicine application and verified that I am speaking with the correct person using two identifiers.  Location of all participants today Patient: at home Provider: at office   I discussed the limitations of evaluation and management by telemedicine and the availability of in person appointments. The patient expressed understanding and agreed to proceed.  History of Present Illness: Here itwh c/o 2 mo recurring resp infections and cough, most recent episode last wk now improved but still fatigued after miultiple infections, has been not scheduled this wk for rest by an understanding employer [employer]; but suggested by her manager to seek a letter of accomation to limit shifts to 6 hrs.  Pt continues to have recurring LBP without change in severity, bowel or bladder change, fever, wt loss,  worsening LE pain/numbness/weakness, gait change or falls, but still has episodes of pain every 1-2 wks, only somewhat improved with nsaid and tylenol.  Pt denies chest pain, increased sob or doe, wheezing, orthopnea, PND, increased LE swelling, palpitations, dizziness or syncope.   Pt denies polydipsia, polyuria, or new focal neuro s/s.    Pt denies fever, wt loss, night sweats, loss of appetite, or other constitutional symptoms   Past Medical History:  Diagnosis Date   Allergic rhinitis    Anxiety    Childhood asthma    Depression 05/19/2017   Migraine    Recurrent upper respiratory infection (URI)    No past surgical history on file.  reports that she has quit smoking. Her smoking use included e-cigarettes. She has been exposed to tobacco smoke. She has never used smokeless tobacco. She reports current alcohol use. She reports that she does not use drugs. family history includes AAA (abdominal aortic  aneurysm) in her maternal grandfather; Alcohol abuse in her maternal grandfather; Arthritis in her maternal grandmother, mother, paternal grandfather, and paternal grandmother; Asthma in her mother; Bone cancer in her maternal grandmother; COPD in her maternal grandfather; Cancer in her maternal grandfather; Depression in her father, mother, and paternal grandmother; Heart disease in her maternal grandmother; Hyperlipidemia in her mother and paternal grandfather; Hypertension in her paternal grandmother; Stroke in her maternal grandmother. Allergies  Allergen Reactions   Promethazine-Dm Shortness Of Breath   Current Outpatient Medications on File Prior to Visit  Medication Sig Dispense Refill   albuterol (VENTOLIN HFA) 108 (90 Base) MCG/ACT inhaler Inhale 2 puffs into the lungs every 6 (six) hours as needed for wheezing or shortness of breath. 18 g 1   azithromycin (ZITHROMAX) 250 MG tablet Sig as indicated 6 tablet 0   benzonatate (TESSALON) 200 MG capsule Take 1 capsule (200 mg total) by mouth 2 (two) times daily as needed for cough. 20 capsule 0   Budeson-Glycopyrrol-Formoterol (BREZTRI AEROSPHERE) 160-9-4.8 MCG/ACT AERO Inhale 2 puffs into the lungs in the morning and at bedtime. 10.7 g 5   cetirizine (ZYRTEC) 10 MG tablet Take 1 tablet (10 mg total) by mouth daily. 30 tablet 5   citalopram (CELEXA) 40 MG tablet Take 1 tablet (40 mg total) by mouth daily. 90 tablet 3   HYDROcodone bit-homatropine (HYCODAN) 5-1.5 MG/5ML syrup Take 5 mLs by mouth at bedtime as needed for cough. 120 mL 0   levothyroxine (SYNTHROID) 25 MCG tablet Take 1 tablet (25 mcg total) by mouth daily. 90 tablet  3   levothyroxine (SYNTHROID) 25 MCG tablet Take 1 tablet by mouth daily.     Mepolizumab (NUCALA) 100 MG/ML SOAJ Inject 1 mL (100 mg total) into the skin every 28 (twenty-eight) days. 1 mL 11   Olopatadine-Mometasone (RYALTRIS) 665-25 MCG/ACT SUSP Place 2 sprays into the nose 2 (two) times daily. 29 g 5   rizatriptan  (MAXALT) 10 MG tablet TAKE 1 TABLET BY MOUTH AS NEEDED FOR MIGRAINE. MAY REPEAT IN 2 HOURS IF NEEDED 8 tablet 1   Spacer/Aero-Holding Chambers DEVI Use as directed with inhaler. 1 each 0   Current Facility-Administered Medications on File Prior to Visit  Medication Dose Route Frequency Provider Last Rate Last Admin   mepolizumab (NUCALA) injection 100 mg  100 mg Subcutaneous Q28 days Kennith Gain, MD   100 mg at 04/14/22 1711    Observations/Objective: Alert, NAD, appropriate mood and affect, resps normal, cn 2-12 intact, moves all 4s, no visible rash or swelling Lab Results  Component Value Date   WBC 6.4 02/15/2022   HGB 13.9 02/15/2022   HCT 40.6 02/15/2022   PLT 237.0 02/15/2022   GLUCOSE 85 02/15/2022   CHOL 171 02/15/2022   TRIG 104.0 02/15/2022   HDL 50.00 02/15/2022   LDLCALC 101 (H) 02/15/2022   ALT 10 02/15/2022   AST 16 02/15/2022   NA 135 02/15/2022   K 4.4 02/15/2022   CL 101 02/15/2022   CREATININE 0.92 02/15/2022   BUN 12 02/15/2022   CO2 27 02/15/2022   TSH 7.13 (H) 02/15/2022   Assessment and Plan: See notes  Follow Up Instructions: See notes   I discussed the assessment and treatment plan with the patient. The patient was provided an opportunity to ask questions and all were answered. The patient agreed with the plan and demonstrated an understanding of the instructions.   The patient was advised to call back or seek an in-person evaluation if the symptoms worsen or if the condition fails to improve as anticipated.  Cathlean Cower, MD

## 2022-05-25 NOTE — Progress Notes (Signed)
Patient ID: Valerie [name]. Gundlach, female   DOB: [birth year], 24 y.o.   MRN: [id number]

## 2022-05-28 ENCOUNTER — Other Ambulatory Visit (HOSPITAL_COMMUNITY): Payer: Self-pay

## 2022-05-28 ENCOUNTER — Ambulatory Visit: Payer: BC Managed Care – PPO

## 2022-05-28 DIAGNOSIS — R0681 Apnea, not elsewhere classified: Secondary | ICD-10-CM

## 2022-05-28 DIAGNOSIS — G4733 Obstructive sleep apnea (adult) (pediatric): Secondary | ICD-10-CM

## 2022-05-28 DIAGNOSIS — G4719 Other hypersomnia: Secondary | ICD-10-CM

## 2022-05-31 ENCOUNTER — Other Ambulatory Visit (HOSPITAL_COMMUNITY): Payer: Self-pay

## 2022-06-05 ENCOUNTER — Telehealth: Payer: Self-pay | Admitting: Pulmonary Disease

## 2022-06-05 DIAGNOSIS — G4733 Obstructive sleep apnea (adult) (pediatric): Secondary | ICD-10-CM | POA: Diagnosis not present

## 2022-06-05 NOTE — Telephone Encounter (Signed)
Call patient  Sleep study result  Date of study: 05/29/2022  Impression: Mild obstructive sleep apnea No significant oxygen desaturations  Recommendation: Options of treatment for mild obstructive sleep apnea will include  1.  CPAP therapy if there is significant daytime sleepiness or other comorbidities including history of CVA or cardiac disease  -If CPAP is chosen as an option of treatment auto titrating CPAP with a pressure setting of 5-15 will be appropriate  2.  Watchful waiting with emphasis on weight loss measures, sleep position modification to optimize lateral sleep, elevating the head of the bed by about 30 degrees may also help.  3.  An oral device may be fashioned for the treatment of mild sleep disordered breathing, will involve referral to dentist.  Follow-up as previously scheduled

## 2022-06-07 NOTE — Telephone Encounter (Signed)
Patient has mild sleep apnea. Please schedule follow-up with me to review sleep study and treatment options

## 2022-06-07 NOTE — Telephone Encounter (Signed)
Called and spoke with pt letting her know the info per BW and she verbalized understanding. Appt scheduled for pt. Nothing further needed.

## 2022-06-09 ENCOUNTER — Other Ambulatory Visit (HOSPITAL_COMMUNITY): Payer: Self-pay

## 2022-06-16 ENCOUNTER — Encounter: Payer: Self-pay | Admitting: Primary Care

## 2022-06-16 ENCOUNTER — Ambulatory Visit (INDEPENDENT_AMBULATORY_CARE_PROVIDER_SITE_OTHER): Payer: BC Managed Care – PPO | Admitting: Primary Care

## 2022-06-16 VITALS — BP 112/68 | HR 107 | Ht 64.5 in | Wt 138.6 lb

## 2022-06-16 DIAGNOSIS — G4730 Sleep apnea, unspecified: Secondary | ICD-10-CM

## 2022-06-16 NOTE — Progress Notes (Signed)
@Patient  ID: Valerie [name]. Valerie [name], female    DOB: 07-27-2000, 22 y.o.   MRN: [id number]  Chief Complaint  Patient presents with   Follow-up    HST    Referring provider: Biagio Borg, MD  HPI: 22 year old female, never smoked.  Past medical history significant for asthma, allergic rhinitis, chronic sinusitis, migraine headaches, depression/anxiety.  Previous [facility] pulmonary encounter: 02/22/2022 Patient presents today for sleep consult due to hypersomnolence. Concern for sleep apnea. She has symptoms of snoring, witnessed apnea and daytime sleepiness. She has been told that she stops breathing in her sleep. She will dream that she is suffocating or drowning in her sleep. She is tired all the time. She never feels rested.  Works at [employer] and sleep schedule often very due to this.  She goes to sleep anywhere between 11 PM and 3 AM.  She will start her day between 4 AM and 12 PM. She naps a lot during the day d/t exhaustion. Naps will last 4 hours. She was recently hx with hypothyroidism. Her mother has sleep apnea, agoraphobia and narcolepsy.   She is on Breztri, albuterol and Nucala for asthma.  Follows with Dr. Nelva Bush with asthma and allergy. She was referred to ENT back in July 2023 d/t chronic sinusitis but cancelled apt d/t illness. She last saw them back during covid.  Sleep questionnaire Symptoms- Feels suffocated, trouble breathing, cant stay asleep, never feels rested, waking up gasping for air Prior sleep study- None  Bedtime- 11pm - 3am  Time to fall asleep- Not long Nocturnal awakenings- every hours  Out of bed/start of day- 4-12pm  Weight changes- 15 lbs down  Do you operate heavy machinery- No Do you currently wear CPAP- No Do you current wear oxygen- No Epworth- 12 Medications- Citalopram 40mg  daily    06/16/2022- interim hx  Patient presents today to review sleep study results.  Patient was originally seen for sleep consult in October 2023 due to several symptoms  concerning for sleep apnea. Her biggest complaint is daytime sleepiness which is extremely disruptive to her. Home sleep study on 05/29/2022 showed mild obstructive sleep apnea, AHI 7.0 an hour with SpO2 low 89% (average 97%). Reviewed sleep study results and treatment options including sleep position, oral appliance, CPAP therapy or referral to ENT for possible surgical options. She is wanting to start CPAP d/t clinical symptoms.    Allergies  Allergen Reactions   Promethazine-Dm Shortness Of Breath    Immunization History  Administered Date(s) Administered   PFIZER(Purple Top)SARS-COV-2 Vaccination 10/22/2019, 02/04/2020    Past Medical History:  Diagnosis Date   Allergic rhinitis    Anxiety    Childhood asthma    Depression 05/19/2017   Migraine    Recurrent upper respiratory infection (URI)     Tobacco History: Social History   Tobacco Use  Smoking Status Former   Types: E-cigarettes   Passive exposure: Yes  Smokeless Tobacco Never  Tobacco Comments   Tried vaping in high school for a short period of time   Counseling given: Not Answered Tobacco comments: Tried vaping in high school for a short period of time   Outpatient Medications Prior to Visit  Medication Sig Dispense Refill   albuterol (VENTOLIN HFA) 108 (90 Base) MCG/ACT inhaler Inhale 2 puffs into the lungs every 6 (six) hours as needed for wheezing or shortness of breath. 18 g 1   benzonatate (TESSALON) 200 MG capsule Take 1 capsule (200 mg total) by mouth 2 (two) times daily as  needed for cough. 20 capsule 0   Budeson-Glycopyrrol-Formoterol (BREZTRI AEROSPHERE) 160-9-4.8 MCG/ACT AERO Inhale 2 puffs into the lungs in the morning and at bedtime. 10.7 g 5   cetirizine (ZYRTEC) 10 MG tablet Take 1 tablet (10 mg total) by mouth daily. 30 tablet 5   citalopram (CELEXA) 40 MG tablet Take 1 tablet (40 mg total) by mouth daily. 90 tablet 3   cyclobenzaprine (FLEXERIL) 5 MG tablet Take 1 tablet (5 mg total) by mouth 3  (three) times daily as needed. 40 tablet 1   Valerie [name] 22 FE 1-20 MG-MCG(24) tablet PLEASE SEE ATTACHED FOR DETAILED DIRECTIONS     levothyroxine (SYNTHROID) 25 MCG tablet Take 1 tablet (25 mcg total) by mouth daily. 90 tablet 3   Mepolizumab (NUCALA) 100 MG/ML SOAJ Inject 1 mL (100 mg total) into the skin every 28 (twenty-eight) days. 1 mL 11   Olopatadine-Mometasone (RYALTRIS) 665-25 MCG/ACT SUSP Place 2 sprays into the nose 2 (two) times daily. 29 g 5   rizatriptan (MAXALT) 10 MG tablet TAKE 1 TABLET BY MOUTH AS NEEDED FOR MIGRAINE. MAY REPEAT IN 2 HOURS IF NEEDED 8 tablet 1   Spacer/Aero-Holding Chambers DEVI Use as directed with inhaler. 1 each 0   azithromycin (ZITHROMAX) 250 MG tablet Sig as indicated (Patient not taking: Reported on 06/16/2022) 6 tablet 0   HYDROcodone bit-homatropine (HYCODAN) 5-1.5 MG/5ML syrup Take 5 mLs by mouth at bedtime as needed for cough. (Patient not taking: Reported on 06/16/2022) 120 mL 0   levothyroxine (SYNTHROID) 25 MCG tablet Take 1 tablet by mouth daily. (Patient not taking: Reported on 06/16/2022)     Facility-Administered Medications Prior to Visit  Medication Dose Route Frequency Provider Last Rate Last Admin   mepolizumab (NUCALA) injection 100 mg  100 mg Subcutaneous Q28 days Kennith Gain, MD   100 mg at 04/14/22 1711      Review of Systems  Review of Systems  Constitutional:  Positive for fatigue.  HENT: Negative.    Respiratory: Negative.    Cardiovascular: Negative.   Psychiatric/Behavioral:  Positive for sleep disturbance.      Physical Exam  BP 112/68 (BP Location: Left Arm, Patient Position: Sitting, Cuff Size: Normal)   Pulse (!) 107   Ht 5' 4.5" (1.638 m)   Wt 138 lb 9.6 oz (62.9 kg)   SpO2 97%   BMI 23.42 kg/m  Physical Exam Constitutional:      Appearance: Normal appearance.  HENT:     Head: Normocephalic and atraumatic.     Mouth/Throat:     Mouth: Mucous membranes are moist.     Pharynx: Oropharynx is  clear.  Cardiovascular:     Rate and Rhythm: Normal rate and regular rhythm.  Pulmonary:     Effort: Pulmonary effort is normal.     Breath sounds: Normal breath sounds.  Musculoskeletal:        General: Normal range of motion.  Skin:    General: Skin is warm and dry.  Neurological:     General: No focal deficit present.     Mental Status: She is alert and oriented to person, place, and time. Mental status is at baseline.  Psychiatric:        Mood and Affect: Mood normal.        Behavior: Behavior normal.        Thought Content: Thought content normal.        Judgment: Judgment normal.      Lab Results:  CBC  Component Value Date/Time   WBC 6.4 02/15/2022 1532   RBC 4.58 02/15/2022 1532   HGB 13.9 02/15/2022 1532   HGB 14.1 03/11/2021 1216   HCT 40.6 02/15/2022 1532   HCT 41.0 03/11/2021 1216   PLT 237.0 02/15/2022 1532   MCV 88.7 02/15/2022 1532   MCV 88 03/11/2021 1216   MCH 30.3 03/11/2021 1216   MCH 31.1 01/23/2020 1524   MCHC 34.2 02/15/2022 1532   RDW 12.9 02/15/2022 1532   RDW 11.9 03/11/2021 1216   LYMPHSABS 2.2 02/15/2022 1532   LYMPHSABS 2.5 03/11/2021 1216   MONOABS 0.5 02/15/2022 1532   EOSABS 0.1 02/15/2022 1532   EOSABS 0.6 (H) 03/11/2021 1216   BASOSABS 0.0 02/15/2022 1532   BASOSABS 0.1 03/11/2021 1216    BMET    Component Value Date/Time   NA 135 02/15/2022 1532   K 4.4 02/15/2022 1532   CL 101 02/15/2022 1532   CO2 27 02/15/2022 1532   GLUCOSE 85 02/15/2022 1532   BUN 12 02/15/2022 1532   CREATININE 0.92 02/15/2022 1532   CREATININE 0.88 01/23/2020 1524   CALCIUM 9.8 02/15/2022 1532   GFRNONAA 94 01/23/2020 1524   GFRAA 109 01/23/2020 1524    BNP No results found for: "BNP"  ProBNP No results found for: "PROBNP"  Imaging: No results found.   Assessment & Plan:   Mild sleep apnea - Patient has symptoms of significant daytime sleepiness and sleep distruption. Epworth score 12. Home sleep study on 05/29/2022 showed mild  obstructive sleep apnea, AHI 7.0/hour. Reviewed treatment options include positional sleep, oral appliance, CPAP therapy or referral to ENT for possible surgical options. Due to significant daytime sleepiness recommend we start auto CPAP 5-10cm h20. Patient is in agreement with plan.  Advised him to wear CPAP every night for minimum 4 to 6 hours or longer.  Encourage patient focus on side sleeping position.  Patient will need follow-up 31 to 90 days after starting CPAP for compliance check.   Glenford Bayley, NP 06/16/2022

## 2022-06-16 NOTE — Assessment & Plan Note (Addendum)
-  Patient has symptoms of significant daytime sleepiness and sleep distruption. Epworth score 12. Home sleep study on 05/29/2022 showed mild obstructive sleep apnea, AHI 7.0/hour. Reviewed treatment options include positional sleep, oral appliance, CPAP therapy or referral to ENT for possible surgical options. Due to significant daytime sleepiness recommend we start auto CPAP 5-10cm h20. Patient is in agreement with plan.  Advised him to wear CPAP every night for minimum 4 to 6 hours or longer.  Encourage patient focus on side sleeping position.  Patient will need follow-up 31 to 90 days after starting CPAP for compliance check.

## 2022-06-16 NOTE — Patient Instructions (Signed)
Home sleep study on 05/29/2022 showed mild obstructive sleep apnea, you had on average 7.0 apneic/hypopneic events an hour  Treatment options include positional sleep, oral appliance, CPAP therapy or referral to ENT for possible surgical options  Due to significant daytime sleepiness recommend we start CPAP  Orders Auto CPAP 5 to 10 cm H2O with mask of choice  Follow-up Please call to schedule follow-up 1 to 2 months after starting CPAP for compliance check or sooner if needed  CPAP and BIPAP Information CPAP and BIPAP are methods that use air pressure to keep your airways open and to help you breathe well. CPAP and BIPAP use different amounts of pressure. Your health care provider will tell you whether CPAP or BIPAP would be more helpful for you. CPAP stands for "continuous positive airway pressure." With CPAP, the amount of pressure stays the same while you breathe in (inhale) and out (exhale). BIPAP stands for "bi-level positive airway pressure." With BIPAP, the amount of pressure will be higher when you inhale and lower when you exhale. This allows you to take larger breaths. CPAP or BIPAP may be used in the hospital, or your health care provider may want you to use it at home. You may need to have a sleep study before your health care provider can order a machine for you to use at home. What are the advantages? CPAP or BIPAP can be helpful if you have: Sleep apnea. Chronic obstructive pulmonary disease (COPD). Heart failure. Medical conditions that cause muscle weakness, including muscular dystrophy or amyotrophic lateral sclerosis (ALS). Other problems that cause breathing to be shallow, weak, abnormal, or difficult. CPAP and BIPAP are most commonly used for obstructive sleep apnea (OSA) to keep the airways from collapsing when the muscles relax during sleep. What are the risks? Generally, this is a safe treatment. However, problems may occur, including: Irritated skin or skin sores if  the mask does not fit properly. Dry or stuffy nose or nosebleeds. Dry mouth. Feeling gassy or bloated. Sinus or lung infection if the equipment is not cleaned properly. When should CPAP or BIPAP be used? In most cases, the mask only needs to be worn during sleep. Generally, the mask needs to be worn throughout the night and during any daytime naps. People with certain medical conditions may also need to wear the mask at other times, such as when they are awake. Follow instructions from your health care provider about when to use the machine. What happens during CPAP or BIPAP?  Both CPAP and BIPAP are provided by a small machine with a flexible plastic tube that attaches to a plastic mask that you wear. Air is blown through the mask into your nose or mouth. The amount of pressure that is used to blow the air can be adjusted on the machine. Your health care provider will set the pressure setting and help you find the best mask for you. Tips for using the mask Because the mask needs to be snug, some people feel trapped or closed-in (claustrophobic) when first using the mask. If you feel this way, you may need to get used to the mask. One way to do this is to hold the mask loosely over your nose or mouth and then gradually apply the mask more snugly. You can also gradually increase the amount of time that you use the mask. Masks are available in various types and sizes. If your mask does not fit well, talk with your health care provider about getting a different  one. Some common types of masks include: Full face masks, which fit over the mouth and nose. Nasal masks, which fit over the nose. Nasal pillow or prong masks, which fit into the nostrils. If you are using a mask that fits over your nose and you tend to breathe through your mouth, a chin strap may be applied to help keep your mouth closed. Use a skin barrier to protect your skin as told by your health care provider. Some CPAP and BIPAP machines  have alarms that may sound if the mask comes off or develops a leak. If you have trouble with the mask, it is very important that you talk with your health care provider about finding a way to make the mask easier to tolerate. Do not stop using the mask. There could be a negative impact on your health if you stop using the mask. Tips for using the machine Place your CPAP or BIPAP machine on a secure table or stand near an electrical outlet. Know where the on/off switch is on the machine. Follow instructions from your health care provider about how to set the pressure on your machine and when you should use it. Do not eat or drink while the CPAP or BIPAP machine is on. Food or fluids could get pushed into your lungs by the pressure of the CPAP or BIPAP. For home use, CPAP and BIPAP machines can be rented or purchased through home health care companies. Many different brands of machines are available. Renting a machine before purchasing may help you find out which particular machine works well for you. Your health insurance company may also decide which machine you may get. Keep the CPAP or BIPAP machine and attachments clean. Ask your health care provider for specific instructions. Check the humidifier if you have a dry stuffy nose or nosebleeds. Make sure it is working correctly. Follow these instructions at home: Take over-the-counter and prescription medicines only as told by your health care provider. Ask if you can take sinus medicine if your sinuses are blocked. Do not use any products that contain nicotine or tobacco. These products include cigarettes, chewing tobacco, and vaping devices, such as e-cigarettes. If you need help quitting, ask your health care provider. Keep all follow-up visits. This is important. Contact a health care provider if: You have redness or pressure sores on your head, face, mouth, or nose from the mask or head gear. You have trouble using the CPAP or BIPAP  machine. You cannot tolerate wearing the CPAP or BIPAP mask. Someone tells you that you snore even when wearing your CPAP or BIPAP. Get help right away if: You have trouble breathing. You feel confused. Summary CPAP and BIPAP are methods that use air pressure to keep your airways open and to help you breathe well. If you have trouble with the mask, it is very important that you talk with your health care provider about finding a way to make the mask easier to tolerate. Do not stop using the mask. There could be a negative impact to your health if you stop using the mask. Follow instructions from your health care provider about when to use the machine. This information is not intended to replace advice given to you by your health care provider. Make sure you discuss any questions you have with your health care provider. Document Revised: 12/17/2020 Document Reviewed: 04/18/2020 Elsevier Patient Education  [place].

## 2022-06-18 ENCOUNTER — Other Ambulatory Visit (HOSPITAL_COMMUNITY): Payer: Self-pay

## 2022-06-18 NOTE — Progress Notes (Signed)
Reviewed and agree with assessment/plan.   Mckay Brandt, MD [facility] 06/18/2022, 7:21 AM Pager:  [phone]  

## 2022-06-24 DIAGNOSIS — Z419 Encounter for procedure for purposes other than remedying health state, unspecified: Secondary | ICD-10-CM | POA: Diagnosis not present

## 2022-06-30 ENCOUNTER — Other Ambulatory Visit: Payer: Self-pay | Admitting: Internal Medicine

## 2022-07-05 DIAGNOSIS — R6882 Decreased libido: Secondary | ICD-10-CM | POA: Diagnosis not present

## 2022-07-05 DIAGNOSIS — R300 Dysuria: Secondary | ICD-10-CM | POA: Diagnosis not present

## 2022-07-05 DIAGNOSIS — R350 Frequency of micturition: Secondary | ICD-10-CM | POA: Diagnosis not present

## 2022-07-13 ENCOUNTER — Other Ambulatory Visit (HOSPITAL_COMMUNITY): Payer: Self-pay

## 2022-07-15 ENCOUNTER — Encounter: Payer: Self-pay | Admitting: Nurse Practitioner

## 2022-07-15 ENCOUNTER — Telehealth (INDEPENDENT_AMBULATORY_CARE_PROVIDER_SITE_OTHER): Payer: BC Managed Care – PPO | Admitting: Nurse Practitioner

## 2022-07-15 DIAGNOSIS — H6690 Otitis media, unspecified, unspecified ear: Secondary | ICD-10-CM | POA: Insufficient documentation

## 2022-07-15 DIAGNOSIS — R051 Acute cough: Secondary | ICD-10-CM

## 2022-07-15 MED ORDER — HYDROCODONE BIT-HOMATROP MBR 5-1.5 MG/5ML PO SOLN: 5.0000 mL | Freq: Every evening | ORAL | 0 refills | Status: DC | PRN

## 2022-07-15 MED ORDER — AMOXICILLIN-POT CLAVULANATE 875-125 MG PO TABS: 1.0000 | ORAL_TABLET | Freq: Two times a day (BID) | ORAL | 0 refills | Status: DC

## 2022-07-15 NOTE — Assessment & Plan Note (Signed)
Acute, history does seem consistent with otitis media bilaterally and/or sinusitis.  Will treat empirically with Augmentin x 10 days.  Due to cough will also prescribe cough suppressant syrup which patient has tolerated in the past.  Patient to call office if symptoms persist or worsen for in person evaluation.  Also encouraged patient to consider testing for COVID using COVID-19 test kit at home, if positive recommended patient call office back to notify us for additional treatment recommendations.

## 2022-07-15 NOTE — Progress Notes (Signed)
   Established Patient Office Visit  An audio/visual tele-health visit was completed today for this patient. I connected with  Niyati R. Market on 07/15/22 utilizing audio/visual technology and verified that I am speaking with the correct person using two identifiers. The patient was located at their home, and I was located at the office of [place] at [facility] during the encounter. I discussed the limitations of evaluation and management by telemedicine. The patient expressed understanding and agreed to proceed.     Subjective   Patient ID: Valerie [name], female    DOB: 05-Oct-2000  Age: 22 y.o. MRN: [id number]  Chief Complaint  Patient presents with   Tinnitus    Both ear ( especially the right one), bright light green mucus, and cough    Symptom onset 4 days ago.  Had a fever day of onset, fever has now resolved.  Is experiencing sore throat which is improving, worsening bilateral ear pain, and worsening cough.  Also has headache.  Has not tested for COVID or strep throat.    Review of Systems  Constitutional:  Positive for fever.  HENT:  Positive for ear pain, sinus pain and sore throat.   Neurological:  Positive for headaches.      Objective:     SpO2 96%    Physical Exam Comprehensive physical exam not completed today as office visit was conducted remotely.  Patient appears fairly well over video no signs of respiratory distress noted.  Patient was alert and oriented, and appeared to have appropriate judgment.   No results found for any visits on 07/15/22.    The ASCVD Risk score (Arnett DK, et al., 2019) failed to calculate for the following reasons:   The 2019 ASCVD risk score is only valid for ages 55 to 32    Assessment & Plan:   Problem List Items Addressed This Visit       Nervous and Auditory   Acute otitis media - Primary    Acute, history does seem consistent with otitis media bilaterally and/or sinusitis.  Will treat empirically  with Augmentin x 10 days.  Due to cough will also prescribe cough suppressant syrup which patient has tolerated in the past.  Patient to call office if symptoms persist or worsen for in person evaluation.  Also encouraged patient to consider testing for COVID using COVID-19 test kit at home, if positive recommended patient call office back to notify us for additional treatment recommendations.      Relevant Medications   amoxicillin-clavulanate (AUGMENTIN) 875-125 MG tablet     Other   Acute cough    Acute, history does seem consistent with otitis media bilaterally and/or sinusitis.  Will treat empirically with Augmentin x 10 days.  Due to cough will also prescribe cough suppressant syrup which patient has tolerated in the past.  Patient to call office if symptoms persist or worsen for in person evaluation.  Also encouraged patient to consider testing for COVID using COVID-19 test kit at home, if positive recommended patient call office back to notify us for additional treatment recommendations.      Relevant Medications   HYDROcodone bit-homatropine (HYCODAN) 5-1.5 MG/5ML syrup    Return if symptoms worsen or fail to improve.    Ailene Ards, NP

## 2022-07-15 NOTE — Addendum Note (Signed)
Addended by: Ailene Ards on: 07/15/2022 05:31 PM   Modules accepted: Level of Service

## 2022-07-23 DIAGNOSIS — Z419 Encounter for procedure for purposes other than remedying health state, unspecified: Secondary | ICD-10-CM | POA: Diagnosis not present

## 2022-07-29 ENCOUNTER — Other Ambulatory Visit (HOSPITAL_COMMUNITY): Payer: Self-pay

## 2022-08-01 ENCOUNTER — Ambulatory Visit (INDEPENDENT_AMBULATORY_CARE_PROVIDER_SITE_OTHER): Payer: BC Managed Care – PPO

## 2022-08-01 ENCOUNTER — Ambulatory Visit
Admission: EM | Admit: 2022-08-01 | Discharge: 2022-08-01 | Disposition: A | Discharge status: Home / Self Care | Payer: BC Managed Care – PPO | Attending: Internal Medicine | Admitting: Internal Medicine

## 2022-08-01 ENCOUNTER — Encounter: Payer: Self-pay | Admitting: Emergency Medicine

## 2022-08-01 DIAGNOSIS — R059 Cough, unspecified: Secondary | ICD-10-CM

## 2022-08-01 DIAGNOSIS — R0602 Shortness of breath: Secondary | ICD-10-CM

## 2022-08-01 DIAGNOSIS — R053 Chronic cough: Secondary | ICD-10-CM | POA: Diagnosis not present

## 2022-08-01 DIAGNOSIS — J069 Acute upper respiratory infection, unspecified: Secondary | ICD-10-CM

## 2022-08-01 MED ORDER — BENZONATATE 100 MG PO CAPS: 100.0000 mg | ORAL_CAPSULE | Freq: Three times a day (TID) | ORAL | 0 refills | Status: DC | PRN

## 2022-08-01 MED ORDER — DOXYCYCLINE HYCLATE 100 MG PO CAPS: 100.0000 mg | ORAL_CAPSULE | Freq: Two times a day (BID) | ORAL | 0 refills | Status: AC

## 2022-08-01 MED ORDER — PREDNISONE 10 MG (21) PO TBPK: ORAL_TABLET | Freq: Every day | ORAL | 0 refills | Status: DC

## 2022-08-01 NOTE — Discharge Instructions (Signed)
X-ray was normal.  I have prescribed an antibiotic and a steroid taper.  Cough medication has also been prescribed.  Follow-up if any symptoms persist or worsen.

## 2022-08-01 NOTE — ED Provider Notes (Signed)
[facility]    CSN: [id number] Arrival date & time: 08/01/22  0900      History   Chief Complaint Chief Complaint  Patient presents with   Cough    HPI Valerie [name] is a 22 y.o. female.   Patient presents with cough, nasal congestion, shortness of breath that has been present for a few weeks to a month.  Patient reports cough is productive.  She has had persistent shortness of breath and some intermittent wheezing as well.  Patient reports that she feels like she hears a gurgling sound and has had "pain in her lungs" on the left lower side recently as well.  Was seen by video visit on 07/15/2022 and prescribed Augmentin antibiotic which she took completely with minimal improvement.  Reports history of severe asthma where she has to have injections.  Also uses Breztri inhaler.  Patient also has albuterol rescue inhaler which she has been using with minimal improvement.  Denies any associated fever.   Cough   Past Medical History:  Diagnosis Date   Allergic rhinitis    Anxiety    Childhood asthma    Depression 05/19/2017   Migraine    Recurrent upper respiratory infection (URI)     Patient Active Problem List   Diagnosis Date Noted   Acute otitis media 07/15/2022   Acute cough 07/15/2022   Recurrent respiratory infection 05/25/2022   Chronic low back pain 05/25/2022   Mild sleep apnea 02/22/2022   Hypersomnolence 02/15/2022   Sinusitis 12/20/2020   Chronic sinusitis 04/28/2019   Insomnia 11/13/2018   Deviated nasal septum 11/13/2018   Asthma 12/27/2017   Depression 05/19/2017   Mild intermittent asthma    Allergic rhinitis    Migraine    Anxiety     History reviewed. No pertinent surgical history.  OB History   No obstetric history on file.      Home Medications    Prior to Admission medications   Medication Sig Start Date End Date Taking? Authorizing Provider  benzonatate (TESSALON) 100 MG capsule Take 1 capsule (100 mg total) by  mouth every 8 (eight) hours as needed for cough. 08/01/22  Yes Laurrie Toppin, Hildred Alamin E, FNP  doxycycline (VIBRAMYCIN) 100 MG capsule Take 1 capsule (100 mg total) by mouth 2 (two) times daily for 7 days. 08/01/22 08/08/22 Yes Monico Sudduth, Michele Rockers, FNP  predniSONE (STERAPRED UNI-PAK 21 TAB) 10 MG (21) TBPK tablet Take by mouth daily. Take 6 tabs by mouth daily  for 2 days, then 5 tabs for 2 days, then 4 tabs for 2 days, then 3 tabs for 2 days, 2 tabs for 2 days, then 1 tab by mouth daily for 2 days 08/01/22  Yes Tong Pieczynski, Hildred Alamin E, FNP  albuterol (VENTOLIN HFA) 108 (90 Base) MCG/ACT inhaler Inhale 2 puffs into the lungs every 6 (six) hours as needed for wheezing or shortness of breath. 04/14/22   Kennith Gain, MD  Budeson-Glycopyrrol-Formoterol (BREZTRI AEROSPHERE) 160-9-4.8 MCG/ACT AERO Inhale 2 puffs into the lungs in the morning and at bedtime. 04/14/22   Kennith Gain, MD  cetirizine (ZYRTEC) 10 MG tablet Take 1 tablet (10 mg total) by mouth daily. 04/14/22   Kennith Gain, MD  citalopram (CELEXA) 40 MG tablet Take 1 tablet (40 mg total) by mouth daily. 11/26/21   Biagio Borg, MD  cyclobenzaprine (FLEXERIL) 5 MG tablet Take 1 tablet (5 mg total) by mouth 3 (three) times daily as needed. 05/25/22   Biagio Borg,  MD  [name] 24 FE 1-20 MG-MCG(24) tablet PLEASE SEE ATTACHED FOR DETAILED DIRECTIONS 04/19/22   [provider]  HYDROcodone bit-homatropine (HYCODAN) 5-1.5 MG/5ML syrup Take 5 mLs by mouth at bedtime as needed for cough. 07/15/22   Ailene Ards, NP  levothyroxine (SYNTHROID) 25 MCG tablet Take 1 tablet (25 mcg total) by mouth daily. 02/16/22   Biagio Borg, MD  levothyroxine (SYNTHROID) 25 MCG tablet Take 1 tablet by mouth daily.    [provider]  Mepolizumab (NUCALA) 100 MG/ML SOAJ Inject 1 mL (100 mg total) into the skin every 28 (twenty-eight) days. 04/30/22   Kennith Gain, MD  Olopatadine-Mometasone Rennie Plowman) [phone] MCG/ACT SUSP Place 2 sprays  into the nose 2 (two) times daily. 04/14/22   Kennith Gain, MD  rizatriptan (MAXALT) 10 MG tablet TAKE 1 TABLET BY MOUTH AS NEEDED FOR MIGRAINE. MAY REPEAT IN 2 HOURS IF NEEDED 06/30/22   Biagio Borg, MD  Spacer/Aero-Holding [facility] Use as directed with inhaler. 07/27/21   Althea Charon, FNP    Family History Family History  Problem Relation Age of Onset   Arthritis Mother    Asthma Mother    Depression Mother    Hyperlipidemia Mother    Depression Father    Arthritis Maternal Grandmother    Bone cancer Maternal Grandmother    Heart disease Maternal Grandmother    Stroke Maternal Grandmother    Alcohol abuse Maternal Grandfather    COPD Maternal Grandfather    Cancer Maternal Grandfather    AAA (abdominal aortic aneurysm) Maternal Grandfather    Arthritis Paternal Grandmother    Depression Paternal Grandmother    Hypertension Paternal Grandmother    Arthritis Paternal Grandfather    Hyperlipidemia Paternal Grandfather     Social History Social History   Tobacco Use   Smoking status: Former    Types: E-cigarettes    Passive exposure: Yes   Smokeless tobacco: Never   Tobacco comments:    Tried vaping in high school for a short period of time  Vaping Use   Vaping Use: Former   Devices: 3 years ago, for about 2 months and then she quit  Substance Use Topics   Alcohol use: Yes    Comment: 0cc   Drug use: No     Allergies   Promethazine-dm   Review of Systems Review of Systems Per HPI  Physical Exam Triage Vital Signs ED Triage Vitals  Enc Vitals Group     BP 08/01/22 0940 109/77     Pulse Rate 08/01/22 0940 85     Resp 08/01/22 0940 16     Temp 08/01/22 0940 97.8 F (36.6 C)     Temp Source 08/01/22 0940 Oral     SpO2 08/01/22 0940 97 %     Weight --      Height --      Head Circumference --      Peak Flow --      Pain Score 08/01/22 0939 6     Pain Loc --      Pain Edu? --      Excl. in [place]? --    No data found.  Updated  Vital Signs BP 109/77 (BP Location: Left Arm)   Pulse 85   Temp 97.8 F (36.6 C) (Oral)   Resp 16   LMP  (LMP Unknown)   SpO2 97%   Visual Acuity Right Eye Distance:   Left Eye Distance:   Bilateral Distance:  Right Eye Near:   Left Eye Near:    Bilateral Near:     Physical Exam Constitutional:      General: She is not in acute distress.    Appearance: Normal appearance. She is not toxic-appearing or diaphoretic.  HENT:     Head: Normocephalic and atraumatic.     Right Ear: Tympanic membrane and ear canal normal.     Left Ear: Tympanic membrane and ear canal normal.     Nose: Congestion present.     Mouth/Throat:     Mouth: Mucous membranes are moist.     Pharynx: No posterior oropharyngeal erythema.  Eyes:     Extraocular Movements: Extraocular movements intact.     Conjunctiva/sclera: Conjunctivae normal.     Pupils: Pupils are equal, round, and reactive to light.  Cardiovascular:     Rate and Rhythm: Normal rate and regular rhythm.     Pulses: Normal pulses.     Heart sounds: Normal heart sounds.  Pulmonary:     Effort: Pulmonary effort is normal. No respiratory distress.     Breath sounds: Normal breath sounds. No stridor. No wheezing, rhonchi or rales.  Abdominal:     General: Abdomen is flat. Bowel sounds are normal.     Palpations: Abdomen is soft.  Musculoskeletal:        General: Normal range of motion.     Cervical back: Normal range of motion.  Skin:    General: Skin is warm and dry.  Neurological:     General: No focal deficit present.     Mental Status: She is alert and oriented to person, place, and time. Mental status is at baseline.  Psychiatric:        Mood and Affect: Mood normal.        Behavior: Behavior normal.      UC Treatments / Results  Labs (all labs ordered are listed, but only abnormal results are displayed) Labs Reviewed - No data to display  EKG   Radiology DG Chest 2 View  Result Date: 08/01/2022 CLINICAL DATA:   22 year old female with productive cough and shortness of breath. Finished amoxicillin 3 days ago. EXAM: CHEST - 2 VIEW COMPARISON:  Chest radiographs 06/23/2020 and earlier. FINDINGS: PA and lateral views at 1020 hours. Lung volumes and mediastinal contours are normal. Visualized tracheal air column is within normal limits. Lung markings have not significantly changed since 2019 and both lungs appear clear. No pneumothorax or pleural effusion. Negative visible bowel gas and osseous structures. IMPRESSION: Negative.  No cardiopulmonary abnormality. Electronically Signed   By: Genevie Ann M.D.   On: 08/01/2022 10:25    Procedures Procedures (including critical care time)  Medications Ordered in UC Medications - No data to display  Initial Impression / Assessment and Plan / UC Course  I have reviewed the triage vital signs and the nursing notes.  Pertinent labs & imaging results that were available during my care of the patient were reviewed by me and considered in my medical decision making (see chart for details).     Chest x-ray was negative for any acute cardiopulmonary process.  Suspect possible persistent asthma exacerbation versus bronchitis.  Will treat with prednisone steroid taper as patient has taken this before and tolerated well.  I am also concerned for persistent nasal congestion and secondary bacterial infection so will treat with doxycycline given minimal improvement with Augmentin.  Cough medication also prescribed for patient.  Advised strict return and ER precautions.  Patient  verbalized understanding and was agreeable with plan. Final Clinical Impressions(s) / UC Diagnoses   Final diagnoses:  Persistent cough  Shortness of breath  Acute upper respiratory infection     Discharge Instructions      X-ray was normal.  I have prescribed an antibiotic and a steroid taper.  Cough medication has also been prescribed.  Follow-up if any symptoms persist or worsen.    ED  Prescriptions     Medication Sig Dispense Auth. Provider   doxycycline (VIBRAMYCIN) 100 MG capsule Take 1 capsule (100 mg total) by mouth 2 (two) times daily for 7 days. 14 capsule [place], [place] E, [place]   predniSONE (STERAPRED UNI-PAK 21 TAB) 10 MG (21) TBPK tablet Take by mouth daily. Take 6 tabs by mouth daily  for 2 days, then 5 tabs for 2 days, then 4 tabs for 2 days, then 3 tabs for 2 days, 2 tabs for 2 days, then 1 tab by mouth daily for 2 days 42 tablet [place], [place] E, [place]   benzonatate (TESSALON) 100 MG capsule Take 1 capsule (100 mg total) by mouth every 8 (eight) hours as needed for cough. 21 capsule [place], Michele Rockers, [place]      PDMP not reviewed this encounter.   Teodora Medici, [place] 08/01/22 1055

## 2022-08-01 NOTE — ED Triage Notes (Signed)
Pt c/o productive cough and SOB x 1 month. Pt was treated with amoxicillin and has finished that 3 days ago. A few days ago she heard gurgling in her lungs and spit up a lot of mucus today.

## 2022-08-05 ENCOUNTER — Other Ambulatory Visit (HOSPITAL_COMMUNITY): Payer: Self-pay

## 2022-08-06 ENCOUNTER — Other Ambulatory Visit: Payer: Self-pay

## 2022-08-12 ENCOUNTER — Ambulatory Visit: Payer: BC Managed Care – PPO | Admitting: Allergy

## 2022-08-18 ENCOUNTER — Telehealth: Payer: Self-pay | Admitting: Pulmonary Disease

## 2022-08-18 NOTE — Telephone Encounter (Signed)
Patient is calling to get an update on when she can receive her cpap machine.  She stated she already had the sleep study but is still waiting for the machine.  Please advise and call patient to discuss further at [phone]

## 2022-08-19 ENCOUNTER — Ambulatory Visit (INDEPENDENT_AMBULATORY_CARE_PROVIDER_SITE_OTHER): Payer: BC Managed Care – PPO | Admitting: Internal Medicine

## 2022-08-19 VITALS — BP 120/68 | HR 88 | Temp 98.0°F | Ht 64.5 in | Wt 139.0 lb

## 2022-08-19 DIAGNOSIS — R051 Acute cough: Secondary | ICD-10-CM

## 2022-08-19 DIAGNOSIS — F419 Anxiety disorder, unspecified: Secondary | ICD-10-CM

## 2022-08-19 DIAGNOSIS — R053 Chronic cough: Secondary | ICD-10-CM | POA: Diagnosis not present

## 2022-08-19 DIAGNOSIS — J4541 Moderate persistent asthma with (acute) exacerbation: Secondary | ICD-10-CM | POA: Diagnosis not present

## 2022-08-19 DIAGNOSIS — J45901 Unspecified asthma with (acute) exacerbation: Secondary | ICD-10-CM | POA: Insufficient documentation

## 2022-08-19 MED ORDER — METHYLPREDNISOLONE ACETATE 80 MG/ML IJ SUSP
80.0000 mg | Freq: Once | INTRAMUSCULAR | Status: AC
  Administered 2022-08-19: 80 mg via INTRAMUSCULAR

## 2022-08-19 MED ORDER — AZITHROMYCIN 250 MG PO TABS: ORAL_TABLET | ORAL | 1 refills | Status: AC

## 2022-08-19 MED ORDER — METHYLPREDNISOLONE 4 MG PO TBPK: ORAL_TABLET | ORAL | 0 refills | Status: DC

## 2022-08-19 MED ORDER — GUAIFENESIN-CODEINE 100-10 MG/5ML PO SOLN: 5.0000 mL | Freq: Four times a day (QID) | ORAL | 0 refills | Status: DC | PRN

## 2022-08-19 NOTE — Progress Notes (Unsigned)
Patient ID: Alphonzo Dublin. Valerie [name], female   DOB: [birth year], 22 y.o.   MRN: [id number]        Chief Complaint: follow up cough, wheezing, anxiety       HPI:  Valerie [name] is a 22 y.o. female Here with acute onset mild to mod 3-4 days ST, HA, general weakness and malaise, with prod cough greenish sputum, but Pt denies chest pain, increased sob or doe, wheezing, orthopnea, PND, increased LE swelling, palpitations, dizziness or syncope except for mild wheezing sob doe x 1 days.    Pt denies polydipsia, polyuria, or new focal neuro s/s.    Pt denies recent wt loss, night sweats, loss of appetite, or other constitutional symptoms       Wt Readings from Last 3 Encounters:  08/19/22 139 lb (63 kg)  06/16/22 138 lb 9.6 oz (62.9 kg)  04/14/22 143 lb 9.6 oz (65.1 kg)   BP Readings from Last 3 Encounters:  08/19/22 120/68  08/01/22 109/77  06/16/22 112/68         Past Medical History:  Diagnosis Date   Allergic rhinitis    Anxiety    Childhood asthma    Depression 05/19/2017   Migraine    Recurrent upper respiratory infection (URI)    History reviewed. No pertinent surgical history.  reports that she has quit smoking. Her smoking use included e-cigarettes. She has been exposed to tobacco smoke. She has never used smokeless tobacco. She reports current alcohol use. She reports that she does not use drugs. family history includes AAA (abdominal aortic aneurysm) in her maternal grandfather; Alcohol abuse in her maternal grandfather; Arthritis in her maternal grandmother, mother, paternal grandfather, and paternal grandmother; Asthma in her mother; Bone cancer in her maternal grandmother; COPD in her maternal grandfather; Cancer in her maternal grandfather; Depression in her father, mother, and paternal grandmother; Heart disease in her maternal grandmother; Hyperlipidemia in her mother and paternal grandfather; Hypertension in her paternal grandmother; Stroke in her maternal grandmother. Allergies   Allergen Reactions   Promethazine-Dm Shortness Of Breath   Current Outpatient Medications on File Prior to Visit  Medication Sig Dispense Refill   albuterol (VENTOLIN HFA) 108 (90 Base) MCG/ACT inhaler Inhale 2 puffs into the lungs every 6 (six) hours as needed for wheezing or shortness of breath. 18 g 1   benzonatate (TESSALON) 100 MG capsule Take 1 capsule (100 mg total) by mouth every 8 (eight) hours as needed for cough. 21 capsule 0   Budeson-Glycopyrrol-Formoterol (BREZTRI AEROSPHERE) 160-9-4.8 MCG/ACT AERO Inhale 2 puffs into the lungs in the morning and at bedtime. 10.7 g 5   cetirizine (ZYRTEC) 10 MG tablet Take 1 tablet (10 mg total) by mouth daily. 30 tablet 5   citalopram (CELEXA) 40 MG tablet Take 1 tablet (40 mg total) by mouth daily. 90 tablet 3   cyclobenzaprine (FLEXERIL) 5 MG tablet Take 1 tablet (5 mg total) by mouth 3 (three) times daily as needed. 40 tablet 1   [name] 24 FE 1-20 MG-MCG(24) tablet PLEASE SEE ATTACHED FOR DETAILED DIRECTIONS     HYDROcodone bit-homatropine (HYCODAN) 5-1.5 MG/5ML syrup Take 5 mLs by mouth at bedtime as needed for cough. 120 mL 0   levothyroxine (SYNTHROID) 25 MCG tablet Take 1 tablet (25 mcg total) by mouth daily. 90 tablet 3   levothyroxine (SYNTHROID) 25 MCG tablet Take 1 tablet by mouth daily.     Mepolizumab (NUCALA) 100 MG/ML SOAJ Inject 1 mL (100 mg total) into the skin every  28 (twenty-eight) days. 1 mL 11   Olopatadine-Mometasone (RYALTRIS) 665-25 MCG/ACT SUSP Place 2 sprays into the nose 2 (two) times daily. 29 g 5   predniSONE (DELTASONE) 10 MG tablet Take by mouth.     predniSONE (STERAPRED UNI-PAK 21 TAB) 10 MG (21) TBPK tablet Take by mouth daily. Take 6 tabs by mouth daily  for 2 days, then 5 tabs for 2 days, then 4 tabs for 2 days, then 3 tabs for 2 days, 2 tabs for 2 days, then 1 tab by mouth daily for 2 days 42 tablet 0   rizatriptan (MAXALT) 10 MG tablet TAKE 1 TABLET BY MOUTH AS NEEDED FOR MIGRAINE. MAY REPEAT IN 2 HOURS IF  NEEDED 8 tablet 5   Spacer/Aero-Holding [place] Use as directed with inhaler. 1 each 0   Current Facility-Administered Medications on File Prior to Visit  Medication Dose Route Frequency Provider Last Rate Last Admin   mepolizumab (NUCALA) injection 100 mg  100 mg Subcutaneous Q28 days Kennith Gain, MD   100 mg at 04/14/22 1711        ROS:  All others reviewed and negative.  Objective        PE:  BP 120/68   Pulse 88   Temp 98 F (36.7 C) (Oral)   Ht 5' 4.5" (1.638 m)   Wt 139 lb (63 kg)   LMP  (LMP Unknown)   SpO2 97%   BMI 23.49 kg/m                 Constitutional: Pt appears in NAD               HENT: Head: NCAT.                Right Ear: External ear normal.                 Left Ear: External ear normal.                Eyes: . Pupils are equal, round, and reactive to light. Conjunctivae and EOM are normal               Nose: without d/c or deformity               Neck: Neck supple. Gross normal ROM               Cardiovascular: Normal rate and regular rhythm.                 Pulmonary/Chest: Effort normal and breath sounds without rales or wheezing.                Abd:  Soft, NT, ND, + BS, no organomegaly               Neurological: Pt is alert. At baseline orientation, motor grossly intact               Skin: Skin is warm. No rashes, no other new lesions, LE edema - none               Psychiatric: Pt behavior is normal without agitation , mild nervous  Micro: none  Cardiac tracings I have personally interpreted today:  none  Pertinent Radiological findings (summarize): none   Lab Results  Component Value Date   WBC 6.4 02/15/2022   HGB 13.9 02/15/2022   HCT 40.6 02/15/2022   PLT 237.0 02/15/2022   GLUCOSE 85 02/15/2022   CHOL  171 02/15/2022   TRIG 104.0 02/15/2022   HDL 50.00 02/15/2022   LDLCALC 101 (H) 02/15/2022   ALT 10 02/15/2022   AST 16 02/15/2022   NA 135 02/15/2022   K 4.4 02/15/2022   CL 101 02/15/2022   CREATININE 0.92  02/15/2022   BUN 12 02/15/2022   CO2 27 02/15/2022   TSH 7.13 (H) 02/15/2022   Assessment/Plan:  Valerie [name] is a 22 y.o. White or Caucasian [1] female with  has a past medical history of Allergic rhinitis, Anxiety, Childhood asthma, Depression (05/19/2017), Migraine, and Recurrent upper respiratory infection (URI).  Anxiety Mild situational, declines need for change in tx or counseling referral for now  Acute cough Mild to mod, viral testing neg, cw bronchitis vs pna, declines cxr, for zpack, cough med prn,  to f/u any worsening symptoms or concerns  Asthma exacerbation Mild to mod, for prednisone taper,,  to f/u any worsening symptoms or concerns    Followup: Return if symptoms worsen or fail to improve.  Cathlean Cower, MD 08/23/2022 12:03 PM [facility]

## 2022-08-19 NOTE — Patient Instructions (Signed)
Your Covid, RSV and Flu testing is negative  You had the steroid shot today - depomedrol  Please take all new medication as prescribed  - the antibiotic, cough medicine, and medrol steroid pack  Please continue all other medications as before, including the inhaler as needed  Please have the pharmacy call with any other refills you may need.  Please keep your appointments with your specialists as you may have planned

## 2022-08-19 NOTE — Telephone Encounter (Signed)
ATC patient x1.  Left detailed message (DPR) that she can contact [facility] at [phone] to get an update on when she may receive her CPAP machine.  Order was sent to [facility] back in January.

## 2022-08-21 ENCOUNTER — Encounter: Payer: Self-pay | Admitting: Internal Medicine

## 2022-08-21 NOTE — Assessment & Plan Note (Signed)
Mild to mod, viral testing neg, cw bronchitis vs pna, declines cxr, for zpack, cough med prn,  to f/u any worsening symptoms or concerns

## 2022-08-21 NOTE — Assessment & Plan Note (Signed)
Mild situational, declines need for change in tx or counseling referral for now

## 2022-08-21 NOTE — Assessment & Plan Note (Signed)
Mild to mod, for prednisone taper,,  to f/u any worsening symptoms or concerns  

## 2022-08-23 DIAGNOSIS — Z419 Encounter for procedure for purposes other than remedying health state, unspecified: Secondary | ICD-10-CM | POA: Diagnosis not present

## 2022-08-23 LAB — POC INFLUENZA A&B (BINAX/QUICKVUE)
Influenza A, POC: NEGATIVE
Influenza B, POC: NEGATIVE

## 2022-08-23 LAB — POC SOFIA SARS ANTIGEN FIA: SARS Coronavirus 2 Ag: NEGATIVE

## 2022-08-23 LAB — POCT RESPIRATORY SYNCYTIAL VIRUS: RSV Rapid Ag: NEGATIVE

## 2022-08-30 DIAGNOSIS — G4733 Obstructive sleep apnea (adult) (pediatric): Secondary | ICD-10-CM | POA: Diagnosis not present

## 2022-09-02 ENCOUNTER — Other Ambulatory Visit (HOSPITAL_COMMUNITY): Payer: Self-pay

## 2022-09-03 ENCOUNTER — Other Ambulatory Visit (HOSPITAL_COMMUNITY): Payer: Self-pay

## 2022-09-22 DIAGNOSIS — Z419 Encounter for procedure for purposes other than remedying health state, unspecified: Secondary | ICD-10-CM | POA: Diagnosis not present

## 2022-09-28 ENCOUNTER — Other Ambulatory Visit (HOSPITAL_COMMUNITY): Payer: Self-pay

## 2022-09-29 DIAGNOSIS — G4733 Obstructive sleep apnea (adult) (pediatric): Secondary | ICD-10-CM | POA: Diagnosis not present

## 2022-10-01 ENCOUNTER — Other Ambulatory Visit (HOSPITAL_COMMUNITY): Payer: Self-pay

## 2022-10-04 ENCOUNTER — Other Ambulatory Visit (HOSPITAL_COMMUNITY): Payer: Self-pay

## 2022-10-11 DIAGNOSIS — Z1151 Encounter for screening for human papillomavirus (HPV): Secondary | ICD-10-CM | POA: Diagnosis not present

## 2022-10-11 DIAGNOSIS — Z124 Encounter for screening for malignant neoplasm of cervix: Secondary | ICD-10-CM | POA: Diagnosis not present

## 2022-10-11 DIAGNOSIS — Z6823 Body mass index (BMI) 23.0-23.9, adult: Secondary | ICD-10-CM | POA: Diagnosis not present

## 2022-10-11 DIAGNOSIS — Z113 Encounter for screening for infections with a predominantly sexual mode of transmission: Secondary | ICD-10-CM | POA: Diagnosis not present

## 2022-10-11 DIAGNOSIS — Z01419 Encounter for gynecological examination (general) (routine) without abnormal findings: Secondary | ICD-10-CM | POA: Diagnosis not present

## 2022-10-27 ENCOUNTER — Other Ambulatory Visit: Payer: Self-pay

## 2022-10-30 DIAGNOSIS — G4733 Obstructive sleep apnea (adult) (pediatric): Secondary | ICD-10-CM | POA: Diagnosis not present

## 2022-11-01 ENCOUNTER — Other Ambulatory Visit (HOSPITAL_COMMUNITY): Payer: Self-pay

## 2022-11-02 ENCOUNTER — Other Ambulatory Visit: Payer: Self-pay

## 2022-11-24 ENCOUNTER — Other Ambulatory Visit (HOSPITAL_COMMUNITY): Payer: Self-pay

## 2022-11-26 ENCOUNTER — Other Ambulatory Visit (HOSPITAL_COMMUNITY): Payer: Self-pay

## 2022-11-29 ENCOUNTER — Ambulatory Visit (HOSPITAL_BASED_OUTPATIENT_CLINIC_OR_DEPARTMENT_OTHER): Payer: BC Managed Care – PPO | Admitting: Primary Care

## 2022-11-29 NOTE — Progress Notes (Deleted)
@Patient  ID: Valerie [name], female    DOB: [birth year], 22 y.o.   MRN: [id number]  No chief complaint on file.   Referring provider: Corwin Levins, MD  HPI: 22 year old female, never smoked.  Past medical history significant for asthma, allergic rhinitis, chronic sinusitis, migraine headaches, depression/anxiety.  Previous [facility] pulmonary encounter: 02/22/2022 Patient presents today for sleep consult due to hypersomnolence. Concern for sleep apnea. She has symptoms of snoring, witnessed apnea and daytime sleepiness. She has been told that she stops breathing in her sleep. She will dream that she is suffocating or drowning in her sleep. She is tired all the time. She never feels rested.  Works at [employer] and sleep schedule often very due to this.  She goes to sleep anywhere between 11 PM and 3 AM.  She will start her day between 4 AM and 12 PM. She naps a lot during the day d/t exhaustion. Naps will last 4 hours. She was recently hx with hypothyroidism. Her mother has sleep apnea, agoraphobia and narcolepsy.   She is on Breztri, albuterol and Nucala for asthma.  Follows with Dr. Delorse Lek with asthma and allergy. She was referred to ENT back in July 2023 d/t chronic sinusitis but cancelled apt d/t illness. She last saw them back during covid.  Sleep questionnaire Symptoms- Feels suffocated, trouble breathing, cant stay asleep, never feels rested, waking up gasping for air Prior sleep study- None  Bedtime- 11pm - 3am  Time to fall asleep- Not long Nocturnal awakenings- every hours  Out of bed/start of day- 4-12pm  Weight changes- 15 lbs down  Do you operate heavy machinery- No Do you currently wear CPAP- No Do you current wear oxygen- No Epworth- 12 Medications- Citalopram 40mg  daily    06/16/2022 Patient presents today to review sleep study results.  Patient was originally seen for sleep consult in October 2023 due to several symptoms concerning for sleep apnea. Her biggest complaint  is daytime sleepiness which is extremely disruptive to her. Home sleep study on 05/29/2022 showed mild obstructive sleep apnea, AHI 7.0 an hour with SpO2 low 89% (average 97%). Reviewed sleep study results and treatment options including sleep position, oral appliance, CPAP therapy or referral to ENT for possible surgical options. She is wanting to start CPAP d/t clinical symptoms.   Mild sleep apnea - Patient has symptoms of significant daytime sleepiness and sleep distruption. Epworth score 12. Home sleep study on 05/29/2022 showed mild obstructive sleep apnea, AHI 7.0/hour. Reviewed treatment options include positional sleep, oral appliance, CPAP therapy or referral to ENT for possible surgical options. Due to significant daytime sleepiness recommend we start auto CPAP 5-10cm h20. Patient is in agreement with plan.  Advised him to wear CPAP every night for minimum 4 to 6 hours or longer.  Encourage patient focus on side sleeping position.  Patient will need follow-up 31 to 90 days after starting CPAP for compliance check.   11/29/2022- interim hx  Patient presents today for 6 month follow-up/OSA on CPAP. Home sleep study on 05/29/2022 showed mild obstructive sleep apnea, AHI 7.0/hour.      Allergies  Allergen Reactions   Promethazine-Dm Shortness Of Breath    Immunization History  Administered Date(s) Administered   PFIZER(Purple Top)SARS-COV-2 Vaccination 10/22/2019, 02/04/2020    Past Medical History:  Diagnosis Date   Allergic rhinitis    Anxiety    Childhood asthma    Depression 05/19/2017   Migraine    Recurrent upper respiratory infection (URI)  Tobacco History: Social History   Tobacco Use  Smoking Status Former   Types: E-cigarettes   Passive exposure: Yes  Smokeless Tobacco Never  Tobacco Comments   Tried vaping in high school for a short period of time   Counseling given: Not Answered Tobacco comments: Tried vaping in high school for a short period of  time   Outpatient Medications Prior to Visit  Medication Sig Dispense Refill   albuterol (VENTOLIN HFA) 108 (90 Base) MCG/ACT inhaler Inhale 2 puffs into the lungs every 6 (six) hours as needed for wheezing or shortness of breath. 18 g 1   benzonatate (TESSALON) 100 MG capsule Take 1 capsule (100 mg total) by mouth every 8 (eight) hours as needed for cough. 21 capsule 0   Budeson-Glycopyrrol-Formoterol (BREZTRI AEROSPHERE) 160-9-4.8 MCG/ACT AERO Inhale 2 puffs into the lungs in the morning and at bedtime. 10.7 g 5   cetirizine (ZYRTEC) 10 MG tablet Take 1 tablet (10 mg total) by mouth daily. 30 tablet 5   citalopram (CELEXA) 40 MG tablet Take 1 tablet (40 mg total) by mouth daily. 90 tablet 3   cyclobenzaprine (FLEXERIL) 5 MG tablet Take 1 tablet (5 mg total) by mouth 3 (three) times daily as needed. 40 tablet 1   guaiFENesin-codeine 100-10 MG/5ML syrup Take 5 mLs by mouth every 6 (six) hours as needed for cough. 120 mL 0   [name] 24 FE 1-20 MG-MCG(24) tablet PLEASE SEE ATTACHED FOR DETAILED DIRECTIONS     HYDROcodone bit-homatropine (HYCODAN) 5-1.5 MG/5ML syrup Take 5 mLs by mouth at bedtime as needed for cough. 120 mL 0   levothyroxine (SYNTHROID) 25 MCG tablet Take 1 tablet (25 mcg total) by mouth daily. 90 tablet 3   levothyroxine (SYNTHROID) 25 MCG tablet Take 1 tablet by mouth daily.     Mepolizumab (NUCALA) 100 MG/ML SOAJ Inject 1 mL (100 mg total) into the skin every 28 (twenty-eight) days. 1 mL 11   methylPREDNISolone (MEDROL DOSEPAK) 4 MG TBPK tablet 4 tab by mouth x 3 days, 2 tabs x 3 days, 1 tab x 3 days 21 tablet 0   Olopatadine-Mometasone (RYALTRIS) 665-25 MCG/ACT SUSP Place 2 sprays into the nose 2 (two) times daily. 29 g 5   predniSONE (DELTASONE) 10 MG tablet Take by mouth.     predniSONE (STERAPRED UNI-PAK 21 TAB) 10 MG (21) TBPK tablet Take by mouth daily. Take 6 tabs by mouth daily  for 2 days, then 5 tabs for 2 days, then 4 tabs for 2 days, then 3 tabs for 2 days, 2 tabs for  2 days, then 1 tab by mouth daily for 2 days 42 tablet 0   rizatriptan (MAXALT) 10 MG tablet TAKE 1 TABLET BY MOUTH AS NEEDED FOR MIGRAINE. MAY REPEAT IN 2 HOURS IF NEEDED 8 tablet 5   Spacer/Aero-Holding Chambers DEVI Use as directed with inhaler. 1 each 0   Facility-Administered Medications Prior to Visit  Medication Dose Route Frequency Provider Last Rate Last Admin   mepolizumab (NUCALA) injection 100 mg  100 mg Subcutaneous Q28 days Marcelyn Bruins, MD   100 mg at 04/14/22 1711      Review of Systems  Review of Systems   Physical Exam  There were no vitals taken for this visit. Physical Exam   Lab Results:  CBC    Component Value Date/Time   WBC 6.4 02/15/2022 1532   RBC 4.58 02/15/2022 1532   HGB 13.9 02/15/2022 1532   HGB 14.1 03/11/2021 1216  HCT 40.6 02/15/2022 1532   HCT 41.0 03/11/2021 1216   PLT 237.0 02/15/2022 1532   MCV 88.7 02/15/2022 1532   MCV 88 03/11/2021 1216   MCH 30.3 03/11/2021 1216   MCH 31.1 01/23/2020 1524   MCHC 34.2 02/15/2022 1532   RDW 12.9 02/15/2022 1532   RDW 11.9 03/11/2021 1216   LYMPHSABS 2.2 02/15/2022 1532   LYMPHSABS 2.5 03/11/2021 1216   MONOABS 0.5 02/15/2022 1532   EOSABS 0.1 02/15/2022 1532   EOSABS 0.6 (H) 03/11/2021 1216   BASOSABS 0.0 02/15/2022 1532   BASOSABS 0.1 03/11/2021 1216    BMET    Component Value Date/Time   NA 135 02/15/2022 1532   K 4.4 02/15/2022 1532   CL 101 02/15/2022 1532   CO2 27 02/15/2022 1532   GLUCOSE 85 02/15/2022 1532   BUN 12 02/15/2022 1532   CREATININE 0.92 02/15/2022 1532   CREATININE 0.88 01/23/2020 1524   CALCIUM 9.8 02/15/2022 1532   GFRNONAA 94 01/23/2020 1524   GFRAA 109 01/23/2020 1524    BNP No results found for: "BNP"  ProBNP No results found for: "PROBNP"  Imaging: No results found.   Assessment & Plan:   No problem-specific Assessment & Plan notes found for this encounter.     Glenford Bayley, NP 11/29/2022

## 2022-12-01 ENCOUNTER — Other Ambulatory Visit (HOSPITAL_COMMUNITY): Payer: Self-pay

## 2022-12-02 ENCOUNTER — Other Ambulatory Visit (HOSPITAL_COMMUNITY): Payer: Self-pay

## 2022-12-02 ENCOUNTER — Other Ambulatory Visit: Payer: Self-pay

## 2022-12-24 ENCOUNTER — Other Ambulatory Visit: Payer: Self-pay

## 2022-12-31 ENCOUNTER — Encounter (HOSPITAL_COMMUNITY): Payer: Self-pay

## 2022-12-31 ENCOUNTER — Other Ambulatory Visit (HOSPITAL_COMMUNITY): Payer: Self-pay

## 2023-01-07 ENCOUNTER — Other Ambulatory Visit: Payer: Self-pay

## 2023-01-07 ENCOUNTER — Other Ambulatory Visit: Payer: Self-pay | Admitting: Internal Medicine

## 2023-01-28 DIAGNOSIS — Z113 Encounter for screening for infections with a predominantly sexual mode of transmission: Secondary | ICD-10-CM | POA: Diagnosis not present

## 2023-01-28 DIAGNOSIS — N760 Acute vaginitis: Secondary | ICD-10-CM | POA: Diagnosis not present

## 2023-01-28 DIAGNOSIS — Z3202 Encounter for pregnancy test, result negative: Secondary | ICD-10-CM | POA: Diagnosis not present

## 2023-01-28 DIAGNOSIS — R102 Pelvic and perineal pain: Secondary | ICD-10-CM | POA: Diagnosis not present

## 2023-02-17 DIAGNOSIS — R102 Pelvic and perineal pain: Secondary | ICD-10-CM | POA: Diagnosis not present

## 2023-02-18 ENCOUNTER — Encounter: Payer: Self-pay | Admitting: Internal Medicine

## 2023-02-18 ENCOUNTER — Ambulatory Visit (INDEPENDENT_AMBULATORY_CARE_PROVIDER_SITE_OTHER): Payer: BC Managed Care – PPO | Admitting: Internal Medicine

## 2023-02-18 ENCOUNTER — Other Ambulatory Visit: Payer: Self-pay

## 2023-02-18 VITALS — BP 120/70 | HR 76 | Temp 98.6°F | Ht 64.5 in | Wt 145.0 lb

## 2023-02-18 DIAGNOSIS — M545 Low back pain, unspecified: Secondary | ICD-10-CM

## 2023-02-18 DIAGNOSIS — K219 Gastro-esophageal reflux disease without esophagitis: Secondary | ICD-10-CM

## 2023-02-18 DIAGNOSIS — G8929 Other chronic pain: Secondary | ICD-10-CM | POA: Diagnosis not present

## 2023-02-18 DIAGNOSIS — R1084 Generalized abdominal pain: Secondary | ICD-10-CM | POA: Diagnosis not present

## 2023-02-18 DIAGNOSIS — K921 Melena: Secondary | ICD-10-CM

## 2023-02-18 MED ORDER — PANTOPRAZOLE SODIUM 40 MG PO TBEC: 40.0000 mg | DELAYED_RELEASE_TABLET | Freq: Every day | ORAL | 11 refills | Status: DC

## 2023-02-18 MED ORDER — DICYCLOMINE HCL 20 MG PO TABS: 20.0000 mg | ORAL_TABLET | Freq: Four times a day (QID) | ORAL | 1 refills | Status: DC | PRN

## 2023-02-18 NOTE — Patient Instructions (Signed)
Please take all new medication as prescribed - the protonix 40 mg per day, and dicyclomine as needed for crampy pains  Please continue all other medications as before, and refills have been done if requested.  Please have the pharmacy call with any other refills you may need.  Please keep your appointments with your specialists as you may have planned  You will be contacted regarding the referral for: Gastroenterology'

## 2023-02-18 NOTE — Progress Notes (Unsigned)
Patient ID: Valerie [name]. Valerie [name], female   DOB: 2001-01-22, 22 y.o.   MRN: [id number]        Chief Complaint: follow up lower abd pain, heamtochezia, gerd, low back pain       HPI:  Valerie [name] is a 22 y.o. female here with c/o generalized abd pains maybe more at the lower abd, with crampy pains helped by heating pads, associeated with bowel frequency and small volume hematochezia severa times.  Denies worsening reflux, dysphagia, n/v, other bowel chang.  Did [name] yesterday with neg exam for tenderness, STD neg, UA and cbc lab normal per pt.  Also had pelvic u/s neg.  Pt denies chest pain, increased sob or doe, wheezing, orthopnea, PND, increased LE swelling, palpitations, dizziness or syncope.   Pt denies polydipsia, polyuria, or new focal neuro s/s.    Pt denies fever, wt loss, night sweats, loss of appetite, or other constitutional symptoms  Pt continues to have recurring LBP without change in severity, bowel or bladder change, fever, wt loss,  worsening LE pain/numbness/weakness, gait change or falls.       Wt Readings from Last 3 Encounters:  02/18/23 145 lb (65.8 kg)  08/19/22 139 lb (63 kg)  06/16/22 138 lb 9.6 oz (62.9 kg)   BP Readings from Last 3 Encounters:  02/18/23 120/70  08/19/22 120/68  08/01/22 109/77         Past Medical History:  Diagnosis Date   Allergic rhinitis    Anxiety    Childhood asthma    Depression 05/19/2017   Migraine    Recurrent upper respiratory infection (URI)    History reviewed. No pertinent surgical history.  reports that she has quit smoking. Her smoking use included e-cigarettes. She has been exposed to tobacco smoke. She has never used smokeless tobacco. She reports current alcohol use. She reports that she does not use drugs. family history includes AAA (abdominal aortic aneurysm) in her maternal grandfather; Alcohol abuse in her maternal grandfather; Arthritis in her maternal grandmother, mother, paternal grandfather, and paternal  grandmother; Asthma in her mother; Bone cancer in her maternal grandmother; COPD in her maternal grandfather; Cancer in her maternal grandfather; Depression in her father, mother, and paternal grandmother; Heart disease in her maternal grandmother; Hyperlipidemia in her mother and paternal grandfather; Hypertension in her paternal grandmother; Stroke in her maternal grandmother. Allergies  Allergen Reactions   Promethazine-Dm Shortness Of Breath   Current Outpatient Medications on File Prior to Visit  Medication Sig Dispense Refill   albuterol (VENTOLIN HFA) 108 (90 Base) MCG/ACT inhaler Inhale 2 puffs into the lungs every 6 (six) hours as needed for wheezing or shortness of breath. 18 g 1   benzonatate (TESSALON) 100 MG capsule Take 1 capsule (100 mg total) by mouth every 8 (eight) hours as needed for cough. 21 capsule 0   Budeson-Glycopyrrol-Formoterol (BREZTRI AEROSPHERE) 160-9-4.8 MCG/ACT AERO Inhale 2 puffs into the lungs in the morning and at bedtime. 10.7 g 5   cetirizine (ZYRTEC) 10 MG tablet Take 1 tablet (10 mg total) by mouth daily. 30 tablet 5   citalopram (CELEXA) 40 MG tablet TAKE 1 TABLET BY MOUTH EVERY DAY 90 tablet 3   cyclobenzaprine (FLEXERIL) 5 MG tablet Take 1 tablet (5 mg total) by mouth 3 (three) times daily as needed. 40 tablet 1   [name] 24 FE 1-20 MG-MCG(24) tablet PLEASE [name] ATTACHED FOR DETAILED DIRECTIONS     levothyroxine (SYNTHROID) 25 MCG tablet Take 1 tablet (25 mcg total)  by mouth daily. 90 tablet 3   levothyroxine (SYNTHROID) 25 MCG tablet Take 1 tablet by mouth daily.     Mepolizumab (NUCALA) 100 MG/ML SOAJ Inject 1 mL (100 mg total) into the skin every 28 (twenty-eight) days. 1 mL 11   methylPREDNISolone (MEDROL DOSEPAK) 4 MG TBPK tablet 4 tab by mouth x 3 days, 2 tabs x 3 days, 1 tab x 3 days 21 tablet 0   naproxen (NAPROSYN) 500 MG tablet TAKE 1 TABLET TWICE A DAY BY ORAL ROUTE AS NEEDED.     norelgestromin-ethinyl estradiol [facility]) 150-35 MCG/24HR  transdermal patch APPLY 1 PATCH BY TRANSDERMAL ROUTE EVERY WEEK     Olopatadine-Mometasone (RYALTRIS) 665-25 MCG/ACT SUSP Place 2 sprays into the nose 2 (two) times daily. 29 g 5   predniSONE (STERAPRED UNI-PAK 21 TAB) 10 MG (21) TBPK tablet Take by mouth daily. Take 6 tabs by mouth daily  for 2 days, then 5 tabs for 2 days, then 4 tabs for 2 days, then 3 tabs for 2 days, 2 tabs for 2 days, then 1 tab by mouth daily for 2 days 42 tablet 0   rizatriptan (MAXALT) 10 MG tablet TAKE 1 TABLET BY MOUTH AS NEEDED FOR MIGRAINE. MAY REPEAT IN 2 HOURS IF NEEDED 8 tablet 5   Spacer/Aero-Holding Chambers DEVI Use as directed with inhaler. 1 each 0   guaiFENesin-codeine 100-10 MG/5ML syrup Take 5 mLs by mouth every 6 (six) hours as needed for cough. (Patient not taking: Reported on 02/18/2023) 120 mL 0   HYDROcodone bit-homatropine (HYCODAN) 5-1.5 MG/5ML syrup Take 5 mLs by mouth at bedtime as needed for cough. (Patient not taking: Reported on 02/18/2023) 120 mL 0   predniSONE (DELTASONE) 10 MG tablet Take by mouth. (Patient not taking: Reported on 02/18/2023)     Current Facility-Administered Medications on File Prior to Visit  Medication Dose Route Frequency Provider Last Rate Last Admin   mepolizumab (NUCALA) injection 100 mg  100 mg Subcutaneous Q28 days Marcelyn Bruins, MD   100 mg at 04/14/22 1711        ROS:  All others reviewed and negative.  Objective        PE:  BP 120/70 (BP Location: Left Arm, Patient Position: Sitting, Cuff Size: Normal)   Pulse 76   Temp 98.6 F (37 C) (Oral)   Ht 5' 4.5" (1.638 m)   Wt 145 lb (65.8 kg)   LMP 02/18/2023 (Exact Date)   SpO2 100%   BMI 24.50 kg/m                 Constitutional: Pt appears in NAD               HENT: Head: NCAT.                Right Ear: External ear normal.                 Left Ear: External ear normal.                Eyes: . Pupils are equal, round, and reactive to light. Conjunctivae and EOM are normal               Nose:  without d/c or deformity               Neck: Neck supple. Gross normal ROM               Cardiovascular: Normal rate and regular rhythm.  Pulmonary/Chest: Effort normal and breath sounds without rales or wheezing.                Abd:  Soft, NT, ND, + BS, no organomegaly - benign               Neurological: Pt is alert. At baseline orientation, motor grossly intact               Skin: Skin is warm. No rashes, no other new lesions, LE edema - none               Psychiatric: Pt behavior is normal without agitation   Micro: none  Cardiac tracings I have personally interpreted today:  none  Pertinent Radiological findings (summarize): none   Lab Results  Component Value Date   WBC 6.4 02/15/2022   HGB 13.9 02/15/2022   HCT 40.6 02/15/2022   PLT 237.0 02/15/2022   GLUCOSE 85 02/15/2022   CHOL 171 02/15/2022   TRIG 104.0 02/15/2022   HDL 50.00 02/15/2022   LDLCALC 101 (H) 02/15/2022   ALT 10 02/15/2022   AST 16 02/15/2022   NA 135 02/15/2022   K 4.4 02/15/2022   CL 101 02/15/2022   CREATININE 0.92 02/15/2022   BUN 12 02/15/2022   CO2 27 02/15/2022   TSH 7.13 (H) 02/15/2022   Assessment/Plan:  Valerie [name] is a 22 y.o. White or Caucasian [1] female with  has a past medical history of Allergic rhinitis, Anxiety, Childhood asthma, Depression (05/19/2017), Migraine, and Recurrent upper respiratory infection (URI).  Abdominal pain Etiology unclear, I suspect IBS, for dicyclomine trial, refer GI  Chronic low back pain Stable overall,  to f/u any worsening symptoms or concerns  GERD (gastroesophageal reflux disease) Mild to mod, for protonix 40 every day,,  to f/u any worsening symptoms or concerns  Hematochezia Small volume,also for GI referral  Followup: Return if symptoms worsen or fail to improve.  Oliver Barre, MD 02/20/2023 2:47 PM [facility]

## 2023-02-20 ENCOUNTER — Encounter: Payer: Self-pay | Admitting: Internal Medicine

## 2023-02-20 DIAGNOSIS — K219 Gastro-esophageal reflux disease without esophagitis: Secondary | ICD-10-CM | POA: Insufficient documentation

## 2023-02-20 DIAGNOSIS — K921 Melena: Secondary | ICD-10-CM | POA: Insufficient documentation

## 2023-02-20 DIAGNOSIS — R109 Unspecified abdominal pain: Secondary | ICD-10-CM | POA: Insufficient documentation

## 2023-02-20 NOTE — Assessment & Plan Note (Signed)
Etiology unclear, I suspect IBS, for dicyclomine trial, refer GI

## 2023-02-20 NOTE — Assessment & Plan Note (Signed)
Small volume,also for GI referral

## 2023-02-20 NOTE — Assessment & Plan Note (Signed)
Mild to mod, for protonix 40 every day,,  to f/u any worsening symptoms or concerns

## 2023-02-20 NOTE — Assessment & Plan Note (Signed)
Stable overall,  to f/u any worsening symptoms or concerns 

## 2023-02-22 ENCOUNTER — Other Ambulatory Visit: Payer: Self-pay | Admitting: Internal Medicine

## 2023-02-22 ENCOUNTER — Other Ambulatory Visit: Payer: Self-pay

## 2023-02-28 ENCOUNTER — Telehealth: Payer: Self-pay | Admitting: Pharmacist

## 2023-02-28 ENCOUNTER — Other Ambulatory Visit (HOSPITAL_COMMUNITY): Payer: Self-pay

## 2023-02-28 NOTE — Telephone Encounter (Signed)
Pharmacy Patient Advocate Encounter   Received notification from CoverMyMeds that prior authorization for Pantoprazole Sodium 40MG  dr tablets is required/requested.   Insurance verification completed.   The patient is insured through [facility], [facility], [facility] .   Per test claim: PA required; PA started via CoverMyMeds. KEY [id number] . Waiting for clinical questions to populate.

## 2023-03-01 NOTE — Telephone Encounter (Signed)
 Clinical questions answered and PA submitted

## 2023-03-02 NOTE — Telephone Encounter (Signed)
Pharmacy Patient Advocate Encounter  Received notification from [facility] that Prior Authorization for Pantoprazole Sodium 40MG  Tablet DR has been APPROVED from 03-01-2023 to 02-29-2024   PA #/Case ID/Reference #: [id number]

## 2023-03-11 ENCOUNTER — Other Ambulatory Visit: Payer: Self-pay

## 2023-03-11 NOTE — Progress Notes (Signed)
Specialty Pharmacy Refill Coordination Note  Valerie [name] is a 22 y.o. female contacted today regarding refills of specialty medication(s) Mepolizumab   Patient requested Delivery   Delivery date: 03/15/23   Verified address: [address] [place] [address]   Medication will be filled on 03/14/23.

## 2023-03-13 ENCOUNTER — Other Ambulatory Visit: Payer: Self-pay | Admitting: Internal Medicine

## 2023-03-14 ENCOUNTER — Other Ambulatory Visit: Payer: Self-pay

## 2023-03-31 ENCOUNTER — Other Ambulatory Visit: Payer: Self-pay

## 2023-04-01 ENCOUNTER — Other Ambulatory Visit: Payer: Self-pay

## 2023-04-01 ENCOUNTER — Other Ambulatory Visit (HOSPITAL_COMMUNITY): Payer: Self-pay

## 2023-04-01 NOTE — Progress Notes (Signed)
Specialty Pharmacy Ongoing Clinical Assessment Note  Valerie [name] is a 22 y.o. female who is being followed by the specialty pharmacy service for RxSp Asthma/COPD   Patient's specialty medication(s) reviewed today: Mepolizumab   Missed doses in the last 4 weeks: 0   Patient/Caregiver did not have any additional questions or concerns.   Therapeutic benefit summary: Patient is achieving benefit   Adverse events/side effects summary: No adverse events/side effects   Patient's therapy is appropriate to: Continue    Goals Addressed             This Visit's Progress    Minimize recurrence of flares       Patient is on track. Patient will maintain adherence         Follow up:  6 months  Bobette Mo Specialty Pharmacist

## 2023-04-01 NOTE — Progress Notes (Signed)
Specialty Pharmacy Refill Coordination Note  Valerie [name] is a 22 y.o. female contacted today regarding refills of specialty medication(s) Mepolizumab   Patient requested Delivery   Delivery date: 04/08/23   Verified address: [address]   Medication will be filled on 04/07/23.

## 2023-04-08 ENCOUNTER — Telehealth: Payer: Self-pay | Admitting: Allergy

## 2023-04-08 NOTE — Telephone Encounter (Signed)
Called patient to schedule Nucala reapproval. Patient was at work and could not talk at the moment. She will call back later today to schedule appointment.

## 2023-05-04 ENCOUNTER — Other Ambulatory Visit: Payer: Self-pay

## 2023-05-09 ENCOUNTER — Other Ambulatory Visit: Payer: Self-pay

## 2023-05-09 ENCOUNTER — Other Ambulatory Visit: Payer: Self-pay | Admitting: Allergy

## 2023-05-09 MED ORDER — NUCALA 100 MG/ML ~~LOC~~ SOAJ
100.0000 mg | SUBCUTANEOUS | 11 refills | Status: DC
  Filled 2023-05-10: qty 1, 28d supply, fill #0

## 2023-05-09 NOTE — Progress Notes (Signed)
Specialty Pharmacy Refill Coordination Note  Valerie [name] is a 22 y.o. female contacted today regarding refills of specialty medication(s) Mepolizumab [name])   Patient requested (Patient-Rptd) Delivery   Delivery date: (Patient-Rptd) 05/20/23   Verified address: (Patient-Rptd) [address] [place]   Medication will be filled on 12.26.24.  Refill request pending.

## 2023-05-10 ENCOUNTER — Encounter: Payer: Self-pay | Admitting: Internal Medicine

## 2023-05-10 ENCOUNTER — Other Ambulatory Visit (HOSPITAL_COMMUNITY): Payer: Self-pay

## 2023-05-10 ENCOUNTER — Telehealth (INDEPENDENT_AMBULATORY_CARE_PROVIDER_SITE_OTHER): Payer: BC Managed Care – PPO | Admitting: Internal Medicine

## 2023-05-10 DIAGNOSIS — K219 Gastro-esophageal reflux disease without esophagitis: Secondary | ICD-10-CM

## 2023-05-10 DIAGNOSIS — R051 Acute cough: Secondary | ICD-10-CM

## 2023-05-10 DIAGNOSIS — J4530 Mild persistent asthma, uncomplicated: Secondary | ICD-10-CM

## 2023-05-10 MED ORDER — HYDROCODONE BIT-HOMATROP MBR 5-1.5 MG/5ML PO SOLN: 5.0000 mL | Freq: Four times a day (QID) | ORAL | 0 refills | Status: AC | PRN

## 2023-05-10 MED ORDER — AZITHROMYCIN 250 MG PO TABS: ORAL_TABLET | ORAL | 1 refills | Status: AC

## 2023-05-10 NOTE — Patient Instructions (Signed)
Please take all new medication as prescribed 

## 2023-05-10 NOTE — Assessment & Plan Note (Signed)
Stable, continue protonix 40 qd

## 2023-05-10 NOTE — Progress Notes (Signed)
Patient ID: Valerie [name], female   DOB: 2000-06-28, 22 y.o.   MRN: [id number]  Virtual Visit via Video Note  I connected with Eyva R. Sequeira on 05/10/23 at  1:40 PM EST by a video enabled telemedicine application and verified that I am speaking with the correct person using two identifiers.  Location of all particpants today Patient: at home Provider: at office   I discussed the limitations of evaluation and management by telemedicine and the availability of in person appointments. The patient expressed understanding and agreed to proceed.  History of Present Illness: Here with acute onset mild to mod 2-3 days ST, HA, general weakness and malaise, with prod cough greenish sputum, but Pt denies chest pain, increased sob or doe, wheezing, orthopnea, PND, increased LE swelling, palpitations, dizziness or syncope.   Pt denies polydipsia, polyuria, or new focal neuro s/s.    Pt denies fever, wt loss, night sweats, loss of appetite, or other constitutional symptoms  Denies worsening reflux, abd pain, dysphagia, n/v, bowel change or blood. Past Medical History:  Diagnosis Date   Allergic rhinitis    Anxiety    Childhood asthma    Depression 05/19/2017   Migraine    Recurrent upper respiratory infection (URI)    History reviewed. No pertinent surgical history.  reports that she has quit smoking. Her smoking use included e-cigarettes. She has been exposed to tobacco smoke. She has never used smokeless tobacco. She reports current alcohol use. She reports that she does not use drugs. family history includes AAA (abdominal aortic aneurysm) in her maternal grandfather; Alcohol abuse in her maternal grandfather; Arthritis in her maternal grandmother, mother, paternal grandfather, and paternal grandmother; Asthma in her mother; Bone cancer in her maternal grandmother; COPD in her maternal grandfather; Cancer in her maternal grandfather; Depression in her father, mother, and paternal grandmother;  Heart disease in her maternal grandmother; Hyperlipidemia in her mother and paternal grandfather; Hypertension in her paternal grandmother; Stroke in her maternal grandmother. Allergies  Allergen Reactions   Promethazine-Dm Shortness Of Breath   Current Outpatient Medications on File Prior to Visit  Medication Sig Dispense Refill   albuterol (VENTOLIN HFA) 108 (90 Base) MCG/ACT inhaler Inhale 2 puffs into the lungs every 6 (six) hours as needed for wheezing or shortness of breath. 18 g 1   benzonatate (TESSALON) 100 MG capsule Take 1 capsule (100 mg total) by mouth every 8 (eight) hours as needed for cough. 21 capsule 0   Budeson-Glycopyrrol-Formoterol (BREZTRI AEROSPHERE) 160-9-4.8 MCG/ACT AERO Inhale 2 puffs into the lungs in the morning and at bedtime. 10.7 g 5   cetirizine (ZYRTEC) 10 MG tablet Take 1 tablet (10 mg total) by mouth daily. 30 tablet 5   citalopram (CELEXA) 40 MG tablet TAKE 1 TABLET BY MOUTH EVERY DAY 90 tablet 3   cyclobenzaprine (FLEXERIL) 5 MG tablet Take 1 tablet (5 mg total) by mouth 3 (three) times daily as needed. 40 tablet 1   dicyclomine (BENTYL) 20 MG tablet TAKE 1 TABLET (20 MG TOTAL) BY MOUTH 4 (FOUR) TIMES DAILY AS NEEDED FOR SPASMS. 360 tablet 1   guaiFENesin-codeine 100-10 MG/5ML syrup Take 5 mLs by mouth every 6 (six) hours as needed for cough. (Patient not taking: Reported on 02/18/2023) 120 mL 0   [name] 24 FE 1-20 MG-MCG(24) tablet PLEASE SEE ATTACHED FOR DETAILED DIRECTIONS     levothyroxine (SYNTHROID) 25 MCG tablet Take 1 tablet by mouth daily.     levothyroxine (SYNTHROID) 25 MCG tablet TAKE 1 TABLET  BY MOUTH EVERY DAY 90 tablet 3   Mepolizumab (NUCALA) 100 MG/ML SOAJ Inject 1 mL (100 mg total) into the skin every 28 (twenty-eight) days. 1 mL 11   methylPREDNISolone (MEDROL DOSEPAK) 4 MG TBPK tablet 4 tab by mouth x 3 days, 2 tabs x 3 days, 1 tab x 3 days 21 tablet 0   naproxen (NAPROSYN) 500 MG tablet TAKE 1 TABLET TWICE A DAY BY ORAL ROUTE AS NEEDED.      norelgestromin-ethinyl estradiol [facility]) 150-35 MCG/24HR transdermal patch APPLY 1 PATCH BY TRANSDERMAL ROUTE EVERY WEEK     Olopatadine-Mometasone (RYALTRIS) 665-25 MCG/ACT SUSP Place 2 sprays into the nose 2 (two) times daily. 29 g 5   pantoprazole (PROTONIX) 40 MG tablet Take 1 tablet (40 mg total) by mouth daily. 30 tablet 11   predniSONE (DELTASONE) 10 MG tablet Take by mouth. (Patient not taking: Reported on 02/18/2023)     predniSONE (STERAPRED UNI-PAK 21 TAB) 10 MG (21) TBPK tablet Take by mouth daily. Take 6 tabs by mouth daily  for 2 days, then 5 tabs for 2 days, then 4 tabs for 2 days, then 3 tabs for 2 days, 2 tabs for 2 days, then 1 tab by mouth daily for 2 days 42 tablet 0   rizatriptan (MAXALT) 10 MG tablet TAKE 1 TABLET BY MOUTH AS NEEDED FOR MIGRAINE. MAY REPEAT IN 2 HOURS IF NEEDED 8 tablet 5   Spacer/Aero-Holding Chambers DEVI Use as directed with inhaler. 1 each 0   Current Facility-Administered Medications on File Prior to Visit  Medication Dose Route Frequency Provider Last Rate Last Admin   mepolizumab (NUCALA) injection 100 mg  100 mg Subcutaneous Q28 days Marcelyn Bruins, MD   100 mg at 04/14/22 1711   Observations/Objective: Alert, mild ill, appropriate mood and affect, resps normal, cn 2-12 intact, moves all 4s, no visible rash or swelling Lab Results  Component Value Date   WBC 6.4 02/15/2022   HGB 13.9 02/15/2022   HCT 40.6 02/15/2022   PLT 237.0 02/15/2022   GLUCOSE 85 02/15/2022   CHOL 171 02/15/2022   TRIG 104.0 02/15/2022   HDL 50.00 02/15/2022   LDLCALC 101 (H) 02/15/2022   ALT 10 02/15/2022   AST 16 02/15/2022   NA 135 02/15/2022   K 4.4 02/15/2022   CL 101 02/15/2022   CREATININE 0.92 02/15/2022   BUN 12 02/15/2022   CO2 27 02/15/2022   TSH 7.13 (H) 02/15/2022   Assessment and Plan: See notes  Follow Up Instructions: See notes   I discussed the assessment and treatment plan with the patient. The patient was provided an  opportunity to ask questions and all were answered. The patient agreed with the plan and demonstrated an understanding of the instructions.   The patient was advised to call back or seek an in-person evaluation if the symptoms worsen or if the condition fails to improve as anticipated.   Oliver Barre, MD

## 2023-05-10 NOTE — Assessment & Plan Note (Signed)
Stable overall, cont inhaler prn 

## 2023-05-10 NOTE — Assessment & Plan Note (Signed)
Mild to mod, c/w bronchitis vs pna, declines cxr, for antibx course zpack, cough med prn,,  to f/u any worsening symptoms or concerns

## 2023-05-19 ENCOUNTER — Other Ambulatory Visit: Payer: Self-pay

## 2023-05-19 ENCOUNTER — Other Ambulatory Visit (HOSPITAL_COMMUNITY): Payer: Self-pay

## 2023-05-20 ENCOUNTER — Other Ambulatory Visit: Payer: Self-pay

## 2023-05-20 NOTE — Progress Notes (Signed)
05/20/23: Nucala  A prior authorization is required for this medication. Allergy and Asthma is closed until Monday. Left voicemail for patient to call [facility] on Monday with any concerns or updates.

## 2023-05-23 ENCOUNTER — Other Ambulatory Visit: Payer: Self-pay

## 2023-05-24 ENCOUNTER — Other Ambulatory Visit: Payer: Self-pay

## 2023-05-26 ENCOUNTER — Other Ambulatory Visit (HOSPITAL_COMMUNITY): Payer: Self-pay

## 2023-05-26 ENCOUNTER — Other Ambulatory Visit: Payer: Self-pay

## 2023-05-26 ENCOUNTER — Telehealth: Payer: Self-pay

## 2023-05-26 NOTE — Telephone Encounter (Signed)
 Pharmacy showing that Nucala needs PA.

## 2023-05-27 ENCOUNTER — Other Ambulatory Visit: Payer: Self-pay

## 2023-05-31 ENCOUNTER — Other Ambulatory Visit: Payer: Self-pay

## 2023-06-01 ENCOUNTER — Other Ambulatory Visit: Payer: Self-pay

## 2023-06-02 ENCOUNTER — Other Ambulatory Visit: Payer: Self-pay

## 2023-06-08 ENCOUNTER — Ambulatory Visit: Payer: BC Managed Care – PPO | Admitting: Gastroenterology

## 2023-06-09 ENCOUNTER — Other Ambulatory Visit: Payer: Self-pay

## 2023-06-10 ENCOUNTER — Other Ambulatory Visit: Payer: Self-pay

## 2023-06-22 DIAGNOSIS — N926 Irregular menstruation, unspecified: Secondary | ICD-10-CM | POA: Diagnosis not present

## 2023-06-22 DIAGNOSIS — Z3040 Encounter for surveillance of contraceptives, unspecified: Secondary | ICD-10-CM | POA: Diagnosis not present

## 2023-06-22 DIAGNOSIS — R102 Pelvic and perineal pain: Secondary | ICD-10-CM | POA: Diagnosis not present

## 2023-07-16 ENCOUNTER — Other Ambulatory Visit: Payer: Self-pay | Admitting: Internal Medicine

## 2023-07-18 ENCOUNTER — Other Ambulatory Visit: Payer: Self-pay

## 2023-07-21 ENCOUNTER — Other Ambulatory Visit: Payer: Self-pay

## 2023-07-29 ENCOUNTER — Telehealth: Payer: Self-pay

## 2023-07-29 NOTE — Telephone Encounter (Signed)
 Last filled 04/07/2023 No appt since 03/2022 Dr sent new script 04/2023  *sent to [facility]

## 2023-09-07 ENCOUNTER — Other Ambulatory Visit: Payer: Self-pay

## 2023-09-22 ENCOUNTER — Other Ambulatory Visit: Payer: Self-pay

## 2023-09-22 NOTE — Progress Notes (Signed)
 Disenrolling- Nucala  last filled 168 days ago and Nucala  requires new PA before it can be filled again. Patient has not followed up with [facility] for reapproval appointment and has no future appointments scheduled.

## 2023-11-08 ENCOUNTER — Ambulatory Visit: Payer: Self-pay

## 2023-11-08 NOTE — Telephone Encounter (Signed)
 FYI Only or Action Required?: FYI only for provider  Patient was last seen in primary care on 05/10/2023 by Roslyn Coombe, MD. Called Nurse Triage reporting Jaw Pain. Symptoms began today. Interventions attempted: OTC medications: nothing. Symptoms are: unchanged.  Triage Disposition: See PCP When Office is Open (Within 3 Days)  Patient/caregiver understands and will follow disposition?: Yes   Copied from CRM [id number]. Topic: Clinical - Red Word Triage >> Nov 08, 2023  2:38 PM Howard Macho wrote: Red Word that prompted transfer to Nurse Triage: patient called stating she cut herself on a metal piece a week ago and it was not super deep. Patient stated she woke up this morning and it felt like she got punched in the jaw. Patient is amino compromised and have lung issues. Patient want to know if she need a techno shot Reason for Disposition  [1] MILD pain (e.g., does not interfere with normal activities) AND [2] constant AND [3] present > 24 hours  (Exception: Symptom is chronic.)  Answer Assessment - Initial Assessment Questions 1. ONSET: When did the pain start? (e.g., minutes, hours, days)     This morning 2. ONSET: Does the pain come and go, or has it been constant since it started? (e.g., constant, intermittent, fleeting)     Pain has worsened during the day 3. SEVERITY: How bad is the pain?   (Scale 1-10; mild, moderate or severe)   - MILD (1-3): doesn't interfere with normal activities    - MODERATE (4-7): interferes with normal activities or awakens from sleep    - SEVERE (8-10): excruciating pain, unable to do any normal activities      3-4/10 4. LOCATION: Where does it hurt?      Left jaw 5. RASH: Is there any redness, rash, or swelling of the face?     denies 6. FEVER: Do you have a fever? If Yes, ask: What is it, how was it measured, and when did it start?      no 7. OTHER SYMPTOMS: Do you have any other symptoms? (e.g., fever, toothache, nasal discharge, nasal  congestion, clicking sensation in jaw joint)     Patient has eosinophelia, but nasal drainage and sneezing more than normal this week 8. PREGNANCY: Is there any chance you are pregnant? When was your last menstrual period?     Denies possibility of pregnancy  Protocols used: Face Pain-A-AH

## 2023-11-09 ENCOUNTER — Other Ambulatory Visit: Payer: Self-pay | Admitting: Internal Medicine

## 2023-11-09 ENCOUNTER — Ambulatory Visit (INDEPENDENT_AMBULATORY_CARE_PROVIDER_SITE_OTHER): Admitting: Internal Medicine

## 2023-11-09 ENCOUNTER — Encounter: Payer: Self-pay | Admitting: Internal Medicine

## 2023-11-09 ENCOUNTER — Ambulatory Visit: Payer: Self-pay | Admitting: Internal Medicine

## 2023-11-09 VITALS — BP 110/64 | HR 95 | Temp 98.9°F | Ht 64.5 in | Wt 146.0 lb

## 2023-11-09 DIAGNOSIS — S61219A Laceration without foreign body of unspecified finger without damage to nail, initial encounter: Secondary | ICD-10-CM | POA: Diagnosis not present

## 2023-11-09 DIAGNOSIS — E039 Hypothyroidism, unspecified: Secondary | ICD-10-CM | POA: Diagnosis not present

## 2023-11-09 DIAGNOSIS — J329 Chronic sinusitis, unspecified: Secondary | ICD-10-CM

## 2023-11-09 DIAGNOSIS — E611 Iron deficiency: Secondary | ICD-10-CM | POA: Diagnosis not present

## 2023-11-09 DIAGNOSIS — E7800 Pure hypercholesterolemia, unspecified: Secondary | ICD-10-CM | POA: Diagnosis not present

## 2023-11-09 DIAGNOSIS — Z23 Encounter for immunization: Secondary | ICD-10-CM | POA: Diagnosis not present

## 2023-11-09 DIAGNOSIS — Z0000 Encounter for general adult medical examination without abnormal findings: Secondary | ICD-10-CM | POA: Diagnosis not present

## 2023-11-09 DIAGNOSIS — Z0001 Encounter for general adult medical examination with abnormal findings: Secondary | ICD-10-CM

## 2023-11-09 DIAGNOSIS — E559 Vitamin D deficiency, unspecified: Secondary | ICD-10-CM

## 2023-11-09 DIAGNOSIS — E538 Deficiency of other specified B group vitamins: Secondary | ICD-10-CM | POA: Diagnosis not present

## 2023-11-09 LAB — URINALYSIS, ROUTINE W REFLEX MICROSCOPIC
Bilirubin Urine: NEGATIVE
Ketones, ur: NEGATIVE
Leukocytes,Ua: NEGATIVE
Nitrite: NEGATIVE
Specific Gravity, Urine: >=1.03 — AB (ref 1.000–1.030)
Total Protein, Urine: NEGATIVE
Urine Glucose: NEGATIVE
Urobilinogen, UA: 0.2 (ref 0.0–1.0)
pH: 6 (ref 5.0–8.0)

## 2023-11-09 LAB — IBC PANEL
Iron: 90 ug/dL (ref 42–145)
Saturation Ratios: 26.8 % (ref 20.0–50.0)
TIBC: 336 ug/dL (ref 250.0–450.0)
Transferrin: 240 mg/dL (ref 212.0–360.0)

## 2023-11-09 LAB — LIPID PANEL
Cholesterol: 147 mg/dL (ref 0–200)
HDL: 51.2 mg/dL (ref >39.00)
LDL Cholesterol: 61 mg/dL (ref 0–99)
NonHDL: 96.22
Total CHOL/HDL Ratio: 3
Triglycerides: 176 mg/dL — ABNORMAL HIGH (ref 0.0–149.0)
VLDL: 35.2 mg/dL (ref 0.0–40.0)

## 2023-11-09 LAB — BASIC METABOLIC PANEL WITH GFR
BUN: 12 mg/dL (ref 6–23)
CO2: 31 meq/L (ref 19–32)
Calcium: 9.7 mg/dL (ref 8.4–10.5)
Chloride: 102 meq/L (ref 96–112)
Creatinine, Ser: 0.98 mg/dL (ref 0.40–1.20)
GFR: 80.27 mL/min (ref >60.00)
Glucose, Bld: 79 mg/dL (ref 70–99)
Potassium: 3.8 meq/L (ref 3.5–5.1)
Sodium: 137 meq/L (ref 135–145)

## 2023-11-09 LAB — CBC WITH DIFFERENTIAL/PLATELET
Basophils Absolute: 0.1 10*3/uL (ref 0.0–0.1)
Basophils Relative: 0.8 % (ref 0.0–3.0)
Eosinophils Absolute: 0.9 10*3/uL — ABNORMAL HIGH (ref 0.0–0.7)
Eosinophils Relative: 12.1 % — ABNORMAL HIGH (ref 0.0–5.0)
HCT: 40.8 % (ref 36.0–46.0)
Hemoglobin: 14.1 g/dL (ref 12.0–15.0)
Lymphocytes Relative: 35.3 % (ref 12.0–46.0)
Lymphs Abs: 2.5 10*3/uL (ref 0.7–4.0)
MCHC: 34.6 g/dL (ref 30.0–36.0)
MCV: 89.6 fl (ref 78.0–100.0)
Monocytes Absolute: 0.5 10*3/uL (ref 0.1–1.0)
Monocytes Relative: 7.3 % (ref 3.0–12.0)
Neutro Abs: 3.2 10*3/uL (ref 1.4–7.7)
Neutrophils Relative %: 44.5 % (ref 43.0–77.0)
Platelets: 252 10*3/uL (ref 150.0–400.0)
RBC: 4.55 Mil/uL (ref 3.87–5.11)
RDW: 12.3 % (ref 11.5–15.5)
WBC: 7.1 10*3/uL (ref 4.0–10.5)

## 2023-11-09 LAB — HEPATIC FUNCTION PANEL
ALT: 13 U/L (ref 0–35)
AST: 18 U/L (ref 0–37)
Albumin: 4.7 g/dL (ref 3.5–5.2)
Alkaline Phosphatase: 60 U/L (ref 39–117)
Bilirubin, Direct: 0.1 mg/dL (ref 0.0–0.3)
Total Bilirubin: 0.4 mg/dL (ref 0.2–1.2)
Total Protein: 7.9 g/dL (ref 6.0–8.3)

## 2023-11-09 LAB — FERRITIN: Ferritin: 19.9 ng/mL (ref 10.0–291.0)

## 2023-11-09 LAB — TSH: TSH: 10.3 u[IU]/mL — ABNORMAL HIGH (ref 0.35–5.50)

## 2023-11-09 LAB — VITAMIN B12: Vitamin B-12: 579 pg/mL (ref 211–911)

## 2023-11-09 LAB — VITAMIN D 25 HYDROXY (VIT D DEFICIENCY, FRACTURES): VITD: 36.5 ng/mL (ref 30.00–100.00)

## 2023-11-09 MED ORDER — AMOXICILLIN-POT CLAVULANATE 875-125 MG PO TABS: 1.0000 | ORAL_TABLET | Freq: Two times a day (BID) | ORAL | 0 refills | Status: DC

## 2023-11-09 MED ORDER — CITALOPRAM HYDROBROMIDE 40 MG PO TABS: 40.0000 mg | ORAL_TABLET | Freq: Every day | ORAL | 3 refills | Status: AC

## 2023-11-09 MED ORDER — PANTOPRAZOLE SODIUM 40 MG PO TBEC: 40.0000 mg | DELAYED_RELEASE_TABLET | Freq: Every day | ORAL | 3 refills | Status: AC

## 2023-11-09 MED ORDER — LEVOTHYROXINE SODIUM 50 MCG PO TABS: 50.0000 ug | ORAL_TABLET | Freq: Every day | ORAL | 3 refills | Status: AC

## 2023-11-09 MED ORDER — LEVOTHYROXINE SODIUM 25 MCG PO TABS: 25.0000 ug | ORAL_TABLET | Freq: Every day | ORAL | 3 refills | Status: DC

## 2023-11-09 MED ORDER — HYDROCODONE BIT-HOMATROP MBR 5-1.5 MG/5ML PO SOLN: 5.0000 mL | Freq: Four times a day (QID) | ORAL | 0 refills | Status: AC | PRN

## 2023-11-09 NOTE — Progress Notes (Unsigned)
 Patient ID: Valerie [name], female   DOB: 11-21-2000, 23 y.o.   MRN: [id number]         Chief Complaint:: wellness exam and Jaw Pain (Has gotten better )  , recent finger laceration, low thyroid        HPI:  Valerie [name] is a 23 y.o. female here for wellness exam; for tdap today,declines pneumovax and HPV, plans to see GYN soon. O/w up to date                         Also has left face and jaw pain that woke her up recently associated with swelling and feeling poorly.  No high fever chills,  .Does have several wks ongoing nasal allergy symptoms with clearish congestion, itch and sneezing, without fever, pain, ST, cough, swelling or wheezing.  Denies hyper or hypo thyroid  symptoms such as voice, skin or hair change.  Pt denies chest pain, increased sob or doe, wheezing, orthopnea, PND, increased LE swelling, palpitations, dizziness or syncope.   Pt denies polydipsia, polyuria, or new focal neuro s/s.    Pt denies recent wt loss, night sweats, loss of appetite, or other constitutional symptoms    Wt Readings from Last 3 Encounters:  11/09/23 146 lb (66.2 kg)  02/18/23 145 lb (65.8 kg)  08/19/22 139 lb (63 kg)   BP Readings from Last 3 Encounters:  11/09/23 110/64  02/18/23 120/70  08/19/22 120/68   Immunization History  Administered Date(s) Administered   PFIZER(Purple Top)SARS-COV-2 Vaccination 10/22/2019, 02/04/2020   Health Maintenance Due  Topic Date Due   HPV VACCINES (1 - 3-dose series) Never done   DTaP/Tdap/Td (1 - Tdap) Never done   Pneumococcal Vaccine 28-62 Years old (1 of 2 - PCV) Never done   Cervical Cancer Screening (Pap smear)  Never done   COVID-19 Vaccine (3 - 2024-25 season) 01/23/2023      Past Medical History:  Diagnosis Date   Allergic rhinitis    Anxiety    Childhood asthma    Depression 05/19/2017   Migraine    Recurrent upper respiratory infection (URI)    History reviewed. No pertinent surgical history.  reports that she has quit smoking.  Her smoking use included e-cigarettes. She has been exposed to tobacco smoke. She has never used smokeless tobacco. She reports current alcohol use. She reports that she does not use drugs. family history includes AAA (abdominal aortic aneurysm) in her maternal grandfather; Alcohol abuse in her maternal grandfather; Arthritis in her maternal grandmother, mother, paternal grandfather, and paternal grandmother; Asthma in her mother; Bone cancer in her maternal grandmother; COPD in her maternal grandfather; Cancer in her maternal grandfather; Depression in her father, mother, and paternal grandmother; Heart disease in her maternal grandmother; Hyperlipidemia in her mother and paternal grandfather; Hypertension in her paternal grandmother; Stroke in her maternal grandmother. Allergies  Allergen Reactions   Promethazine -Dm Shortness Of Breath   Current Outpatient Medications on File Prior to Visit  Medication Sig Dispense Refill   albuterol  (VENTOLIN  HFA) 108 (90 Base) MCG/ACT inhaler Inhale 2 puffs into the lungs every 6 (six) hours as needed for wheezing or shortness of breath. 18 g 1   benzonatate  (TESSALON ) 100 MG capsule Take 1 capsule (100 mg total) by mouth every 8 (eight) hours as needed for cough. 21 capsule 0   Budeson-Glycopyrrol-Formoterol  (BREZTRI  AEROSPHERE) 160-9-4.8 MCG/ACT AERO Inhale 2 puffs into the lungs in the morning and at bedtime. 10.7 g 5  cetirizine  (ZYRTEC ) 10 MG tablet Take 1 tablet (10 mg total) by mouth daily. 30 tablet 5   citalopram  (CELEXA ) 40 MG tablet TAKE 1 TABLET BY MOUTH EVERY DAY 90 tablet 3   clindamycin (CLEOCIN) 300 MG capsule Take 300 mg by mouth every 12 (twelve) hours.     cyclobenzaprine  (FLEXERIL ) 5 MG tablet Take 1 tablet (5 mg total) by mouth 3 (three) times daily as needed. 40 tablet 1   dicyclomine  (BENTYL ) 20 MG tablet TAKE 1 TABLET (20 MG TOTAL) BY MOUTH 4 (FOUR) TIMES DAILY AS NEEDED FOR SPASMS. 360 tablet 1   guaiFENesin -codeine  100-10 MG/5ML syrup  Take 5 mLs by mouth every 6 (six) hours as needed for cough. 120 mL 0   [name] 24 FE 1-20 MG-MCG(24) tablet PLEASE SEE ATTACHED FOR DETAILED DIRECTIONS     HYDROcodone -acetaminophen  (NORCO) 10-325 MG tablet Take 1 tablet by mouth every 6 (six) hours as needed.     HYDROcodone -acetaminophen  (NORCO) 7.5-325 MG tablet Take 1 tablet by mouth every 6 (six) hours as needed.     levothyroxine  (SYNTHROID ) 25 MCG tablet Take 1 tablet by mouth daily.     levothyroxine  (SYNTHROID ) 25 MCG tablet TAKE 1 TABLET BY MOUTH EVERY DAY 90 tablet 3   Mepolizumab  (NUCALA ) 100 MG/ML SOAJ Inject 1 mL (100 mg total) into the skin every 28 (twenty-eight) days. 1 mL 11   methylPREDNISolone  (MEDROL  DOSEPAK) 4 MG TBPK tablet 4 tab by mouth x 3 days, 2 tabs x 3 days, 1 tab x 3 days 21 tablet 0   naproxen  (NAPROSYN ) 500 MG tablet TAKE 1 TABLET TWICE A DAY BY ORAL ROUTE AS NEEDED.     norelgestromin-ethinyl estradiol (XULANE) 150-35 MCG/24HR transdermal patch APPLY 1 PATCH BY TRANSDERMAL ROUTE EVERY WEEK     Olopatadine-Mometasone (RYALTRIS ) 665-25 MCG/ACT SUSP Place 2 sprays into the nose 2 (two) times daily. 29 g 5   pantoprazole  (PROTONIX ) 40 MG tablet Take 1 tablet (40 mg total) by mouth daily. 30 tablet 11   predniSONE  (DELTASONE ) 10 MG tablet Take by mouth.     predniSONE  (STERAPRED UNI-PAK 21 TAB) 10 MG (21) TBPK tablet Take by mouth daily. Take 6 tabs by mouth daily  for 2 days, then 5 tabs for 2 days, then 4 tabs for 2 days, then 3 tabs for 2 days, 2 tabs for 2 days, then 1 tab by mouth daily for 2 days 42 tablet 0   rizatriptan  (MAXALT ) 10 MG tablet TAKE 1 TABLET BY MOUTH AS NEEDED FOR MIGRAINE. MAY REPEAT IN 2 HOURS IF NEEDED 8 tablet 5   Spacer/Aero-Holding [facility] Use as directed with inhaler. 1 each 0   Current Facility-Administered Medications on File Prior to Visit  Medication Dose Route Frequency Provider Last Rate Last Admin   mepolizumab  (NUCALA ) injection 100 mg  100 mg Subcutaneous Q28 days Brian Campanile, MD   100 mg at 04/14/22 1711        ROS:  All others reviewed and negative.  Objective        PE:  BP 110/64 (BP Location: Right Arm, Patient Position: Sitting, Cuff Size: Normal)   Pulse 95   Temp 98.9 F (37.2 C) (Oral)   Ht 5' 4.5 (1.638 m)   Wt 146 lb (66.2 kg)   LMP 11/09/2023 (Exact Date)   SpO2 99%   BMI 24.67 kg/m                 Constitutional: Pt appears in NAD  HENT: Head: NCAT.                Right Ear: External ear normal.                 Left Ear: External ear normal.                Eyes: . Pupils are equal, round, and reactive to light. Conjunctivae and EOM are normal               Nose: without d/c or deformity               Neck: Neck supple. Gross normal ROM               Cardiovascular: Normal rate and regular rhythm.                 Pulmonary/Chest: Effort normal and breath sounds without rales or wheezing.                Abd:  Soft, NT, ND, + BS, no organomegaly               Neurological: Pt is alert. At baseline orientation, motor grossly intact               Skin: Skin is warm. No rashes, no other new lesions, LE edema - none               Psychiatric: Pt behavior is normal without agitation   Micro: none  Cardiac tracings I have personally interpreted today:  none  Pertinent Radiological findings (summarize): none   Lab Results  Component Value Date   WBC 6.4 02/15/2022   HGB 13.9 02/15/2022   HCT 40.6 02/15/2022   PLT 237.0 02/15/2022   GLUCOSE 85 02/15/2022   CHOL 171 02/15/2022   TRIG 104.0 02/15/2022   HDL 50.00 02/15/2022   LDLCALC 101 (H) 02/15/2022   ALT 10 02/15/2022   AST 16 02/15/2022   NA 135 02/15/2022   K 4.4 02/15/2022   CL 101 02/15/2022   CREATININE 0.92 02/15/2022   BUN 12 02/15/2022   CO2 27 02/15/2022   TSH 7.13 (H) 02/15/2022   Assessment/Plan:  Valerie [name] is a 23 y.o. White or Caucasian [1] female with  has a past medical history of Allergic rhinitis, Anxiety, Childhood  asthma, Depression (05/19/2017), Migraine, and Recurrent upper respiratory infection (URI).  No problem-specific Assessment & Plan notes found for this encounter.  Followup: No follow-ups on file.  Rosalia Colonel, MD 11/09/2023 9:28 AM [facility]

## 2023-11-09 NOTE — Patient Instructions (Addendum)
 You had the Tdap tetanus shot today for the finger laceration  Please take all new medication as prescribed -the antibiotic and cough medicine  Please continue all other medications as before, and refills have been done if requested.  Please have the pharmacy call with any other refills you may need.  Please continue your efforts at being more active, low cholesterol diet, and weight control.  You are otherwise up to date with prevention measures today.  Please keep your appointments with your specialists as you may have planned  Please go to the LAB at the blood drawing area for the tests to be done  You will be contacted by phone if any changes need to be made immediately.  Otherwise, you will receive a letter about your results with an explanation, but please check with MyChart first.  Please make an Appointment to return for your 1 year visit, or sooner if needed

## 2023-11-10 LAB — THYROID PANEL WITH TSH
Free Thyroxine Index: 2.6 (ref 1.4–3.8)
T3 Uptake: 29 % (ref 22–35)
T4, Total: 9.1 ug/dL (ref 5.1–11.9)
TSH: 10.77 m[IU]/L — ABNORMAL HIGH

## 2023-11-12 ENCOUNTER — Encounter: Payer: Self-pay | Admitting: Internal Medicine

## 2023-11-12 DIAGNOSIS — S61219A Laceration without foreign body of unspecified finger without damage to nail, initial encounter: Secondary | ICD-10-CM | POA: Insufficient documentation

## 2023-11-12 DIAGNOSIS — Z0001 Encounter for general adult medical examination with abnormal findings: Secondary | ICD-10-CM | POA: Insufficient documentation

## 2023-11-12 NOTE — Assessment & Plan Note (Signed)
 Lab Results  Component Value Date   TSH 10.30 (H) 11/09/2023   TSH 10.77 (H) 11/09/2023   Uncontrolled, pt for increased levothyroxine  to 50 mcg qd

## 2023-11-12 NOTE — Assessment & Plan Note (Signed)
 Age and sex appropriate education and counseling updated with regular exercise and diet Referrals for preventative services - none needed Immunizations addressed - for tdap at pharmacy Smoking counseling  - none needed Evidence for depression or other mood disorder - chronic anxiety stable Most recent labs reviewed. I have personally reviewed and have noted: 1) the patient's medical and social history 2) The patient's current medications and supplements 3) The patient's height, weight, and BMI have been recorded in the chart

## 2023-11-12 NOTE — Assessment & Plan Note (Signed)
Mild to mod, for antibx course augmentin bid,  to f/u any worsening symptoms or concerns 

## 2023-11-12 NOTE — Assessment & Plan Note (Addendum)
 Small left 2nd finger with cooking - for tdap, no bleeding or redness or swelling

## 2023-11-23 ENCOUNTER — Ambulatory Visit: Admitting: Internal Medicine

## 2024-01-16 ENCOUNTER — Ambulatory Visit: Admitting: Internal Medicine

## 2024-01-17 ENCOUNTER — Encounter: Payer: Self-pay | Admitting: Family

## 2024-01-17 ENCOUNTER — Other Ambulatory Visit: Payer: Self-pay

## 2024-01-17 ENCOUNTER — Ambulatory Visit (INDEPENDENT_AMBULATORY_CARE_PROVIDER_SITE_OTHER): Admitting: Family

## 2024-01-17 VITALS — BP 110/70 | HR 90 | Temp 98.3°F

## 2024-01-17 DIAGNOSIS — J329 Chronic sinusitis, unspecified: Secondary | ICD-10-CM

## 2024-01-17 DIAGNOSIS — Z8619 Personal history of other infectious and parasitic diseases: Secondary | ICD-10-CM | POA: Diagnosis not present

## 2024-01-17 DIAGNOSIS — D485 Neoplasm of uncertain behavior of skin: Secondary | ICD-10-CM | POA: Diagnosis not present

## 2024-01-17 DIAGNOSIS — D225 Melanocytic nevi of trunk: Secondary | ICD-10-CM | POA: Diagnosis not present

## 2024-01-17 DIAGNOSIS — B078 Other viral warts: Secondary | ICD-10-CM | POA: Diagnosis not present

## 2024-01-17 DIAGNOSIS — J4550 Severe persistent asthma, uncomplicated: Secondary | ICD-10-CM | POA: Diagnosis not present

## 2024-01-17 MED ORDER — RYALTRIS 665-25 MCG/ACT NA SUSP: 2.0000 | Freq: Two times a day (BID) | NASAL | 5 refills | Status: DC

## 2024-01-17 MED ORDER — BREZTRI AEROSPHERE 160-9-4.8 MCG/ACT IN AERO: INHALATION_SPRAY | RESPIRATORY_TRACT | 5 refills | Status: DC

## 2024-01-17 MED ORDER — AMOXICILLIN-POT CLAVULANATE 875-125 MG PO TABS: 1.0000 | ORAL_TABLET | Freq: Two times a day (BID) | ORAL | 0 refills | Status: DC

## 2024-01-17 MED ORDER — CETIRIZINE HCL 10 MG PO TABS: 10.0000 mg | ORAL_TABLET | Freq: Every day | ORAL | 5 refills | Status: AC

## 2024-01-17 MED ORDER — ALBUTEROL SULFATE HFA 108 (90 BASE) MCG/ACT IN AERS: 2.0000 | INHALATION_SPRAY | Freq: Four times a day (QID) | RESPIRATORY_TRACT | 1 refills | Status: AC | PRN

## 2024-01-17 NOTE — Patient Instructions (Addendum)
 Asthma not well-controlled -Continue Breztri  2 puffs twice a day with spacer to help prevent cough and wheeze.  Rinse mouth after use.  Use Breztri  with spacer device.  -I will send a message to Tammy about restarting Nucala  injections per protocol.  AEC 900 in June 2025   Asthma control goals:  Full participation in all desired activities (may need albuterol  before activity) Albuterol  use two time or less a week on average (not counting use with activity) Cough interfering with sleep two time or less a month Oral steroids no more than once a year No hospitalizations  Rhinitis, nonallergic/ chronic sinusitis   - previous environmental allergy testing has been negative by skin testing and serum IgE testing -Due to your current symptoms I will treat you as if you have a sinus infection.  Start Augmentin  875 mg taking 1 tablet twice a day for 7 days.   - continue taking daily antihistamine like Cetirizine  10mg  daily    - for nasal congestion/drainage continue Ryaltris  2 sprays each nostril twice a day as needed.  - Will refer to ENT (Dr. Tobie) for nonallergic rhinitis.  Consider sinus imaging.  Frequent infections -We will get lab work to check your immune system.  We will call you with results once they are back   Follow-up in 4 weeks with Dr. Jeneal or sooner if needed

## 2024-01-17 NOTE — Progress Notes (Signed)
 [address] [place] [address] Dept: [phone]  FOLLOW UP NOTE  Patient ID: Valerie [name]. Valerie [name], female    DOB: 2000-12-26  Age: 23 y.o. MRN: [id number] Date of Office Visit: 01/17/2024  Assessment  Chief Complaint: Nasal Congestion, Headache, Sinus Problem, and Sinusitis  HPI Valerie [name] is a 23 year old female who presents today for follow-up.  She was last seen on April 14, 2022 by Dr. Jeneal for asthma with exacerbation due to upper respiratory illness and nonallergic rhinitis.  She reports that since her last office visit she has been diagnosed with hemorrhoids and needs a colonoscopy.  She also reports that she had an earlier appointment today with her dermatologist who froze off of 5 areas.  Asthma: She reports at night she will hear a whistling.  She will also have coughing sometimes, but not as often.  She also reports shortness of breath, tightness in her chest, and nocturnal awakenings due to breathing problems.  She will have dreams that she is drowning or suffocating and then will wake up and she figures that this is her asthma.  Since her last office visit she has not not made any trips to the emergency room or urgent care due to breathing problems.  She has probably had around 2 or 3 steroids in the past year due to her asthma.  She suspects that she potentially has Cushing's disease because her face got round after all the steroids.  She is interested in restarting Nucala  injections to help get better control of her asthma.  She reports that she has been off these injections for a while.  She continues to take Breztri  2 puffs twice a day on most days.  She reports that there is sometimes she forgets to take her nighttime dose because she falls asleep.  She mentions that her grandfather recently passed away and she has been using a lot of his unused Breztri  inhalers.  Nonallergic rhinitis: She reports rhinorrhea yesterday that is clear.  Sometimes in the morning  she reports it is not clear.  She has nasal congestion that is better today, but is still congested.  She also reports postnasal drip, and sinus pressure in her ears and face.  She is able to taste, but feels like it is blander.  She is not able to smell and reports that she has not been able to smell the past 4 months.  She is never had sinus surgery.  She reports that she has seen an ENT in the past and felt like the doctor was very dismissive.  She did not ever have any sinus imaging.  She reports that she has had several infections/sinusitis in the past year.  She feels that she has had 7 infections since her last appointment with us .  Maybe 4 sets of antibiotics in the past year.  Her last antibiotic was approximately 2 months ago by her primary care physician.  She feels like she was potentially given Augmentin  or a Z-Pak, but she is not certain of the name.  She reports that it caused her to have stomach issues, but she mentions that she was already having stomach issues.  She denies any concomitant cardiorespiratory or gastrointestinal symptoms.  She reports that she gets a lot of migraines but thinks this may be more sinus congestion.  She is currently taking cetirizine  10 mg daily and Ryaltrist2 sprays each nostril twice a day.  She reports that she did not ever get the lab work completed  to check her immune system that was ordered in 2023 because she gets anxious if she does not understand things.   Drug Allergies:  Allergies  Allergen Reactions   Promethazine -Dm Shortness Of Breath    Review of Systems: Negative except as per HPI  Physical Exam: BP 110/70   Pulse 90   Temp 98.3 F (36.8 C)   SpO2 99%    Physical Exam Constitutional:      Appearance: Normal appearance. She is well-developed.  HENT:     Head: Normocephalic and atraumatic.     Comments: Pharynx normal, eyes normal, ears normal, nose: Bilateral lower turbinates moderately edematous and pale with clear drainage  noted.  Left turbinate greater than right turbinate    Right Ear: Tympanic membrane, ear canal and external ear normal.     Left Ear: Tympanic membrane, ear canal and external ear normal.     Mouth/Throat:     Mouth: Mucous membranes are moist.     Pharynx: Oropharynx is clear.  Eyes:     Conjunctiva/sclera: Conjunctivae normal.  Cardiovascular:     Heart sounds: Normal heart sounds.  Pulmonary:     Effort: Pulmonary effort is normal.     Breath sounds: Normal breath sounds.     Comments: Lungs clear to auscultation Musculoskeletal:     Cervical back: Neck supple.  Skin:    General: Skin is warm.  Neurological:     Mental Status: She is alert and oriented to person, place, and time.  Psychiatric:        Mood and Affect: Mood normal.        Behavior: Behavior normal.        Thought Content: Thought content normal.        Judgment: Judgment normal.     Diagnostics: FVC 3.31 L (86%), FEV1 2.54 L (77%), FEV1/FVC 0.77.  Spirometry indicates normal spirometry-reduced FEV1.  Assessment and Plan: 1. Frequent infections   2. Not well controlled severe persistent asthma   3. Chronic sinusitis, unspecified location     Meds ordered this encounter  Medications   amoxicillin -clavulanate (AUGMENTIN ) 875-125 MG tablet    Sig: Take 1 tablet by mouth 2 (two) times daily.    Dispense:  14 tablet    Refill:  0   budesonide -glycopyrrolate-formoterol  (BREZTRI  AEROSPHERE) 160-9-4.8 MCG/ACT AERO inhaler    Sig: Inhale 2 puffs twice a day with spacer to help prevent cough and wheeze.  Rinse mouth out afterwards.    Dispense:  10.7 g    Refill:  5   albuterol  (VENTOLIN  HFA) 108 (90 Base) MCG/ACT inhaler    Sig: Inhale 2 puffs into the lungs every 6 (six) hours as needed for wheezing or shortness of breath.    Dispense:  18 g    Refill:  1   cetirizine  (ZYRTEC ) 10 MG tablet    Sig: Take 1 tablet (10 mg total) by mouth daily.    Dispense:  30 tablet    Refill:  5   Olopatadine-Mometasone  (RYALTRIS ) 665-25 MCG/ACT SUSP    Sig: Place 2 sprays into the nose 2 (two) times daily.    Dispense:  29 g    Refill:  5    Patient mobile: [phone]    Patient Instructions  Asthma not well-controlled -Continue Breztri  2 puffs twice a day with spacer to help prevent cough and wheeze.  Rinse mouth after use.  Use Breztri  with spacer device.  -I will send a message to Tammy about restarting  Nucala  injections per protocol.  AEC 900 in June 2025   Asthma control goals:  Full participation in all desired activities (may need albuterol  before activity) Albuterol  use two time or less a week on average (not counting use with activity) Cough interfering with sleep two time or less a month Oral steroids no more than once a year No hospitalizations  Rhinitis, nonallergic/ chronic sinusitis   - previous environmental allergy testing has been negative by skin testing and serum IgE testing -Due to your current symptoms I will treat you as if you have a sinus infection.  Start Augmentin  875 mg taking 1 tablet twice a day for 7 days.   - continue taking daily antihistamine like Cetirizine  10mg  daily    - for nasal congestion/drainage continue Ryaltris  2 sprays each nostril twice a day as needed.  - Will refer to ENT (Dr. Tobie) for nonallergic rhinitis.  Consider sinus imaging.  Frequent infections -We will get lab work to check your immune system.  We will call you with results once they are back   Follow-up in 4 weeks with Dr. Jeneal or sooner if needed  Return in about 4 weeks (around 02/14/2024).    Thank you for the opportunity to care for this patient.  Please do not hesitate to contact me with questions.  Wanda Craze, FNP [facility] 

## 2024-01-18 ENCOUNTER — Encounter (INDEPENDENT_AMBULATORY_CARE_PROVIDER_SITE_OTHER): Payer: Self-pay

## 2024-01-21 LAB — STREP PNEUMONIAE 23 SEROTYPES IGG
Pneumo Ab Type 1*: <0.1 ug/mL — ABNORMAL LOW (ref >1.3)
Pneumo Ab Type 12 (12F)*: 0.2 ug/mL — ABNORMAL LOW (ref >1.3)
Pneumo Ab Type 14*: 2.9 ug/mL (ref >1.3)
Pneumo Ab Type 17 (17F)*: 1.3 ug/mL — ABNORMAL LOW (ref >1.3)
Pneumo Ab Type 19 (19F)*: 1.5 ug/mL (ref >1.3)
Pneumo Ab Type 2*: 0.4 ug/mL — ABNORMAL LOW (ref >1.3)
Pneumo Ab Type 20*: 1.9 ug/mL (ref >1.3)
Pneumo Ab Type 22 (22F)*: 0.3 ug/mL — ABNORMAL LOW (ref >1.3)
Pneumo Ab Type 23 (23F)*: 1.3 ug/mL — ABNORMAL LOW (ref >1.3)
Pneumo Ab Type 26 (6B)*: 0.8 ug/mL — ABNORMAL LOW (ref >1.3)
Pneumo Ab Type 3*: 2.7 ug/mL (ref >1.3)
Pneumo Ab Type 34 (10A)*: 2.8 ug/mL (ref >1.3)
Pneumo Ab Type 4*: 1.8 ug/mL (ref >1.3)
Pneumo Ab Type 43 (11A)*: >11.6 ug/mL (ref >1.3)
Pneumo Ab Type 5*: 0.5 ug/mL — ABNORMAL LOW (ref >1.3)
Pneumo Ab Type 51 (7F)*: 0.2 ug/mL — ABNORMAL LOW (ref >1.3)
Pneumo Ab Type 54 (15B)*: 1.2 ug/mL — ABNORMAL LOW (ref >1.3)
Pneumo Ab Type 56 (18C)*: 0.5 ug/mL — ABNORMAL LOW (ref >1.3)
Pneumo Ab Type 57 (19A)*: 3.1 ug/mL (ref >1.3)
Pneumo Ab Type 68 (9V)*: 2.3 ug/mL (ref >1.3)
Pneumo Ab Type 70 (33F)*: 9.4 ug/mL (ref >1.3)
Pneumo Ab Type 8*: 2.7 ug/mL (ref >1.3)
Pneumo Ab Type 9 (9N)*: 3 ug/mL (ref >1.3)

## 2024-01-21 LAB — CBC WITH DIFFERENTIAL/PLATELET
Basophils Absolute: 0.1 x10E3/uL (ref 0.0–0.2)
Basos: 1 %
EOS (ABSOLUTE): 0.6 x10E3/uL — ABNORMAL HIGH (ref 0.0–0.4)
Eos: 11 %
Hematocrit: 42.2 % (ref 34.0–46.6)
Hemoglobin: 14.1 g/dL (ref 11.1–15.9)
Immature Grans (Abs): 0 x10E3/uL (ref 0.0–0.1)
Immature Granulocytes: 0 %
Lymphocytes Absolute: 2.2 x10E3/uL (ref 0.7–3.1)
Lymphs: 41 %
MCH: 31.1 pg (ref 26.6–33.0)
MCHC: 33.4 g/dL (ref 31.5–35.7)
MCV: 93 fL (ref 79–97)
Monocytes Absolute: 0.3 x10E3/uL (ref 0.1–0.9)
Monocytes: 6 %
Neutrophils Absolute: 2.2 x10E3/uL (ref 1.4–7.0)
Neutrophils: 41 %
Platelets: 263 x10E3/uL (ref 150–450)
RBC: 4.53 x10E6/uL (ref 3.77–5.28)
RDW: 11.8 % (ref 11.7–15.4)
WBC: 5.3 x10E3/uL (ref 3.4–10.8)

## 2024-01-21 LAB — DIPHTHERIA / TETANUS ANTIBODY PANEL
Diphtheria Ab: 2.17 [IU]/mL (ref <0.10)
Tetanus Ab, IgG: >7 [IU]/mL (ref <0.10)

## 2024-01-21 LAB — IGG, IGA, IGM
IgA/Immunoglobulin A, Serum: 142 mg/dL (ref 87–352)
IgG (Immunoglobin G), Serum: 1369 mg/dL (ref 586–1602)
IgM (Immunoglobulin M), Srm: 205 mg/dL (ref 26–217)

## 2024-01-21 LAB — COMPLEMENT, TOTAL: Compl, Total (CH50): 59 U/mL (ref >41)

## 2024-01-24 ENCOUNTER — Ambulatory Visit: Admitting: Internal Medicine

## 2024-01-24 ENCOUNTER — Encounter: Payer: Self-pay | Admitting: Internal Medicine

## 2024-01-24 VITALS — BP 122/88 | HR 101 | Temp 98.5°F | Ht 64.5 in | Wt 143.0 lb

## 2024-01-24 DIAGNOSIS — K921 Melena: Secondary | ICD-10-CM

## 2024-01-24 DIAGNOSIS — K5900 Constipation, unspecified: Secondary | ICD-10-CM

## 2024-01-24 DIAGNOSIS — G43809 Other migraine, not intractable, without status migrainosus: Secondary | ICD-10-CM

## 2024-01-24 DIAGNOSIS — K219 Gastro-esophageal reflux disease without esophagitis: Secondary | ICD-10-CM

## 2024-01-24 MED ORDER — QULIPTA 60 MG PO TABS: ORAL_TABLET | ORAL | 3 refills | Status: AC

## 2024-01-24 MED ORDER — TRULANCE 3 MG PO TABS: ORAL_TABLET | ORAL | 3 refills | Status: AC

## 2024-01-24 NOTE — Assessment & Plan Note (Signed)
Etiology unclear, for GI referral

## 2024-01-24 NOTE — Assessment & Plan Note (Signed)
Stable, cont protonix 40 qd

## 2024-01-24 NOTE — Assessment & Plan Note (Signed)
 With recurring HA more than 8 per month - for qulipta  60 mg every day preventive

## 2024-01-24 NOTE — Patient Instructions (Addendum)
 Please take all new medication as prescribed - the Qulipta  for migraine prevention, and Trulance  for constipation  Please continue all other medications as before, and refills have been done if requested.  Please have the pharmacy call with any other refills you may need.  Please continue your efforts at being more active, low cholesterol diet, and weight control.  Please keep your appointments with your specialists as you may have planned  You will be contacted regarding the referral for: Gastroenterology

## 2024-01-24 NOTE — Assessment & Plan Note (Signed)
?   IBS related vs other - also for GI referral, may need colonoscopy despite her age

## 2024-01-24 NOTE — Progress Notes (Signed)
 Patient ID: Valerie [name]. [name], female   DOB: 03-20-2001, 23 y.o.   MRN: [id number]        Chief Complaint: follow up gerd, constipation, hematochezia, migraine recurrent       HPI:  Valerie [name] is a 23 y.o. female here with c/o stable reflux on PPI, Denies worsening reflux, abd pain, dysphagia, n/v, .Also with nausea and constipation, does not want miralax if other ok.  Did have episode recently with what sounds like possible ext hemorrhoid with bleeding .   Pt denies chest pain, increased sob or doe, wheezing, orthopnea, PND, increased LE swelling, palpitations, dizziness or syncope.   Pt denies polydipsia, polyuria, or new focal neuro s/s.  Seen per allergy and asthma last wk with chronic lung disorder, also seen per derm with several lesions refilled  Referred to ENT for possible rhinoplasty, has recurrent sinus disease, but also has recurring migraines over 8 per months, Rizatriptan  not working for migraine any more.  .   Wt Readings from Last 3 Encounters:  01/24/24 143 lb (64.9 kg)  11/09/23 146 lb (66.2 kg)  02/18/23 145 lb (65.8 kg)   BP Readings from Last 3 Encounters:  01/24/24 122/88  01/17/24 110/70  11/09/23 110/64         Past Medical History:  Diagnosis Date   Allergic rhinitis    Anxiety    Childhood asthma    Depression 05/19/2017   Migraine    Recurrent upper respiratory infection (URI)    History reviewed. No pertinent surgical history.  reports that she has quit smoking. Her smoking use included e-cigarettes. She has been exposed to tobacco smoke. She has never used smokeless tobacco. She reports current alcohol use. She reports that she does not use drugs. family history includes AAA (abdominal aortic aneurysm) in her maternal grandfather; Alcohol abuse in her maternal grandfather; Arthritis in her maternal grandmother, mother, paternal grandfather, and paternal grandmother; Asthma in her mother; Bone cancer in her maternal grandmother; COPD in her maternal  grandfather; Cancer in her maternal grandfather; Depression in her father, mother, and paternal grandmother; Heart disease in her maternal grandmother; Hyperlipidemia in her mother and paternal grandfather; Hypertension in her paternal grandmother; Stroke in her maternal grandmother. Allergies  Allergen Reactions   Promethazine -Dm Shortness Of Breath   Current Outpatient Medications on File Prior to Visit  Medication Sig Dispense Refill   albuterol  (VENTOLIN  HFA) 108 (90 Base) MCG/ACT inhaler Inhale 2 puffs into the lungs every 6 (six) hours as needed for wheezing or shortness of breath. 18 g 1   amoxicillin -clavulanate (AUGMENTIN ) 875-125 MG tablet Take 1 tablet by mouth 2 (two) times daily. 14 tablet 0   benzonatate  (TESSALON ) 100 MG capsule Take 1 capsule (100 mg total) by mouth every 8 (eight) hours as needed for cough. 21 capsule 0   budesonide -glycopyrrolate-formoterol  (BREZTRI  AEROSPHERE) 160-9-4.8 MCG/ACT AERO inhaler Inhale 2 puffs twice a day with spacer to help prevent cough and wheeze.  Rinse mouth out afterwards. 10.7 g 5   cetirizine  (ZYRTEC ) 10 MG tablet Take 1 tablet (10 mg total) by mouth daily. 30 tablet 5   citalopram  (CELEXA ) 40 MG tablet Take 1 tablet (40 mg total) by mouth daily. 90 tablet 3   dicyclomine  (BENTYL ) 20 MG tablet TAKE 1 TABLET (20 MG TOTAL) BY MOUTH 4 (FOUR) TIMES DAILY AS NEEDED FOR SPASMS. 360 tablet 1   [name] 24 FE 1-20 MG-MCG(24) tablet PLEASE SEE ATTACHED FOR DETAILED DIRECTIONS     HYDROcodone -acetaminophen  (NORCO) 7.5-325 MG  tablet Take 1 tablet by mouth every 6 (six) hours as needed.     levothyroxine  (SYNTHROID ) 50 MCG tablet Take 1 tablet (50 mcg total) by mouth daily. 90 tablet 3   methylPREDNISolone  (MEDROL  DOSEPAK) 4 MG TBPK tablet 4 tab by mouth x 3 days, 2 tabs x 3 days, 1 tab x 3 days 21 tablet 0   naproxen  (NAPROSYN ) 500 MG tablet TAKE 1 TABLET TWICE A DAY BY ORAL ROUTE AS NEEDED.     norelgestromin-ethinyl estradiol (XULANE) 150-35 MCG/24HR  transdermal patch APPLY 1 PATCH BY TRANSDERMAL ROUTE EVERY WEEK     Olopatadine-Mometasone (RYALTRIS ) 665-25 MCG/ACT SUSP Place 2 sprays into the nose 2 (two) times daily. 29 g 5   pantoprazole  (PROTONIX ) 40 MG tablet Take 1 tablet (40 mg total) by mouth daily. 90 tablet 3   rizatriptan  (MAXALT ) 10 MG tablet TAKE 1 TABLET BY MOUTH AS NEEDED FOR MIGRAINE. MAY REPEAT IN 2 HOURS IF NEEDED 8 tablet 5   cyclobenzaprine  (FLEXERIL ) 5 MG tablet Take 1 tablet (5 mg total) by mouth 3 (three) times daily as needed. (Patient not taking: Reported on 01/24/2024) 40 tablet 1   Mepolizumab  (NUCALA ) 100 MG/ML SOAJ Inject 1 mL (100 mg total) into the skin every 28 (twenty-eight) days. (Patient not taking: Reported on 01/24/2024) 1 mL 11   Spacer/Aero-Holding [facility] Use as directed with inhaler. (Patient not taking: Reported on 01/24/2024) 1 each 0   Current Facility-Administered Medications on File Prior to Visit  Medication Dose Route Frequency Provider Last Rate Last Admin   mepolizumab  (NUCALA ) injection 100 mg  100 mg Subcutaneous Q28 days Jeneal Danita Macintosh, MD   100 mg at 04/14/22 1711        ROS:  All others reviewed and negative.  Objective        PE:  BP 122/88   Pulse (!) 101   Temp 98.5 F (36.9 C)   Ht 5' 4.5 (1.638 m)   Wt 143 lb (64.9 kg)   SpO2 97%   BMI 24.17 kg/m                 Constitutional: Pt appears in NAD               HENT: Head: NCAT.                Right Ear: External ear normal.                 Left Ear: External ear normal.                Eyes: . Pupils are equal, round, and reactive to light. Conjunctivae and EOM are normal               Nose: without d/c or deformity               Neck: Neck supple. Gross normal ROM               Cardiovascular: Normal rate and regular rhythm.                 Pulmonary/Chest: Effort normal and breath sounds without rales or wheezing.                Abd:  Soft, NT, ND, + BS, no organomegaly               Neurological: Pt is  alert. At baseline orientation, motor grossly intact  Skin: Skin is warm. No rashes, no other new lesions, LE edema - none               Psychiatric: Pt behavior is normal without agitation   Micro: none  Cardiac tracings I have personally interpreted today:  none  Pertinent Radiological findings (summarize): none   Lab Results  Component Value Date   WBC 5.3 01/17/2024   HGB 14.1 01/17/2024   HCT 42.2 01/17/2024   PLT 263 01/17/2024   GLUCOSE 79 11/09/2023   CHOL 147 11/09/2023   TRIG 176.0 (H) 11/09/2023   HDL 51.20 11/09/2023   LDLCALC 61 11/09/2023   ALT 13 11/09/2023   AST 18 11/09/2023   NA 137 11/09/2023   K 3.8 11/09/2023   CL 102 11/09/2023   CREATININE 0.98 11/09/2023   BUN 12 11/09/2023   CO2 31 11/09/2023   TSH 10.30 (H) 11/09/2023   TSH 10.77 (H) 11/09/2023   Assessment/Plan:  Valerie [name] is a 23 y.o. White or Caucasian [1] female with  has a past medical history of Allergic rhinitis, Anxiety, Childhood asthma, Depression (05/19/2017), Migraine, and Recurrent upper respiratory infection (URI).  GERD (gastroesophageal reflux disease) Stable, cont protonix  40 qd  Hematochezia Etiology unclear, for GI referral  Migraine With recurring HA more than 8 per month - for qulipta  60 mg every day preventive  Constipation ? IBS related vs other - also for GI referral, may need colonoscopy despite her age  Followup: Return if symptoms worsen or fail to improve.  Lynwood Rush, MD 01/24/2024 8:05 PM [facility]

## 2024-01-25 ENCOUNTER — Ambulatory Visit: Payer: Self-pay | Admitting: Family

## 2024-01-25 NOTE — Progress Notes (Signed)
 Your immune workup was not normal, but we need to do further workup to figure you out. We first looked at immunoglobulins.  Immunoglobulins are proteins that bind to and neutralize bacteria and viruses. Your immunoglobulin levels were normal.  Next we checked your specific immunoglobulins to routine vaccinations.  You were protective against diphtheria. You were protective against tetanus.  We also looked at protection against a bacteria called Streptococcus pneumonia.  This is a bacteria that causes sinus infections, ear infections, and pneumonia.  You were protective to 12 out of 23 strains of Streptococcus pneumonia which is an mediocre response.  I recommend that she get a Pneumovax vaccine which she can get at our office and then we can get repeat lab work in 4-6 weeks to see how her immune system response to the vaccine.  We also looked at complement activity.  Complement as a protein made by your liver which helps your immune system to work more efficiently.  This activity was normal.

## 2024-01-26 ENCOUNTER — Other Ambulatory Visit (HOSPITAL_COMMUNITY): Payer: Self-pay

## 2024-01-27 ENCOUNTER — Other Ambulatory Visit (HOSPITAL_COMMUNITY): Payer: Self-pay

## 2024-01-27 ENCOUNTER — Telehealth: Payer: Self-pay

## 2024-01-27 NOTE — Telephone Encounter (Signed)
 Pharmacy Patient Advocate Encounter   Received notification from Onbase that prior authorization for Qulipta  60 mg tablets is required/requested.   Insurance verification completed.   The patient is insured through [facility] MEDICAID .   Per test claim: PA required; PA submitted to above mentioned insurance via Latent Key/confirmation #/EOC [id number] Status is pending

## 2024-01-30 ENCOUNTER — Other Ambulatory Visit (HOSPITAL_COMMUNITY): Payer: Self-pay

## 2024-01-30 NOTE — Telephone Encounter (Signed)
 Pharmacy Patient Advocate Encounter  Received notification from [facility] MEDICAID that Prior Authorization for Qulipta  60 mg tablets  has been DENIED.  See denial reason below. No denial letter attached in CMM. Will attach denial letter to Media tab once received.   PA #/Case ID/Reference #: [id number]

## 2024-01-31 ENCOUNTER — Other Ambulatory Visit (HOSPITAL_COMMUNITY): Payer: Self-pay

## 2024-01-31 ENCOUNTER — Telehealth: Payer: Self-pay | Admitting: Family

## 2024-01-31 ENCOUNTER — Telehealth: Payer: Self-pay

## 2024-01-31 NOTE — Telephone Encounter (Signed)
 Thanks Joni Reining

## 2024-01-31 NOTE — Telephone Encounter (Signed)
 Valerie [name] is scheduled with Dr. Tobie at [facility] for 10/23 at 11:30 am

## 2024-01-31 NOTE — Telephone Encounter (Signed)
 Pharmacy Patient Advocate Encounter   Received notification from Onbase that prior authorization for Trulance  3MG  tablets  is required/requested.   Insurance verification completed.   The patient is insured through [facility] .   Per test claim: PA required; PA submitted to above mentioned insurance via Latent Key/confirmation #/EOC [id number] Status is pending

## 2024-02-01 NOTE — Telephone Encounter (Signed)
 Pharmacy Patient Advocate Encounter  Received notification from [facility] that Prior Authorization for Trulance  3MG  tablets  has been DENIED.  Full denial letter will be uploaded to the media tab. See denial reason below.   PA #/Case ID/Reference #: [id number]

## 2024-02-03 ENCOUNTER — Other Ambulatory Visit (HOSPITAL_COMMUNITY): Payer: Self-pay

## 2024-02-03 ENCOUNTER — Other Ambulatory Visit: Payer: Self-pay

## 2024-02-07 ENCOUNTER — Other Ambulatory Visit: Payer: Self-pay

## 2024-02-07 ENCOUNTER — Ambulatory Visit (INDEPENDENT_AMBULATORY_CARE_PROVIDER_SITE_OTHER)

## 2024-02-07 ENCOUNTER — Other Ambulatory Visit (HOSPITAL_COMMUNITY): Payer: Self-pay

## 2024-02-07 ENCOUNTER — Telehealth: Payer: Self-pay | Admitting: *Deleted

## 2024-02-07 DIAGNOSIS — Z23 Encounter for immunization: Secondary | ICD-10-CM | POA: Diagnosis not present

## 2024-02-07 MED ORDER — NUCALA 100 MG/ML ~~LOC~~ SOAJ
100.0000 mg | SUBCUTANEOUS | 11 refills | Status: AC
  Filled 2024-02-08: qty 1, 28d supply, fill #0
  Filled 2024-03-05: qty 1, 28d supply, fill #1
  Filled 2024-04-02: qty 1, 28d supply, fill #2

## 2024-02-07 MED ORDER — NUCALA 100 MG/ML ~~LOC~~ SOAJ: 100.0000 mg | SUBCUTANEOUS | 11 refills | Status: DC

## 2024-02-07 NOTE — Telephone Encounter (Signed)
-----   Message from Valerie [name] sent at 01/17/2024  3:40 PM EDT ----- Patient would like to restart Nucala  for not well controlled severe persistent asthma. AEC 900 in June 2025

## 2024-02-07 NOTE — Telephone Encounter (Signed)
 Called patient and advised approval to restart Nucala  to [place].

## 2024-02-07 NOTE — Telephone Encounter (Signed)
 Thanks [employer]

## 2024-02-08 ENCOUNTER — Other Ambulatory Visit: Payer: Self-pay

## 2024-02-08 NOTE — Progress Notes (Signed)
 Specialty Pharmacy Initial Fill Coordination Note  Monta Maiorana. Moller is a 23 y.o. female contacted today regarding initial fill of specialty medication(s) Mepolizumab  (Nucala )   Patient requested Delivery   Delivery date: 02/10/24   Verified address: [address]  [place] [address]   Medication will be filled on 02/09/24.   Patient is aware of $0 copayment.

## 2024-02-08 NOTE — Progress Notes (Signed)
 Specialty Pharmacy Initiation Note   Valerie [name] is a 23 y.o. female who will be followed by the specialty pharmacy service for RxSp Asthma/COPD    Review of administration, indication, effectiveness, safety, potential side effects, storage/disposable, and missed dose instructions occurred today for patient's specialty medication(s) Mepolizumab  (Nucala )     Patient/Caregiver did not have any additional questions or concerns.   Patient's therapy is appropriate to: Initiate (Patient re-initating treatment)    Goals Addressed             This Visit's Progress    Minimize recurrence of flares       Patient is re-initiating medication. Patient will maintain adherence.          Chevon Laufer M Azaiah Mello Specialty Pharmacist

## 2024-02-09 NOTE — Progress Notes (Signed)
 Immunotherapy   Patient Details  Name: Valerie [name]. Woodring MRN: [id number] Date of Birth: [birth year]  02/09/2024  Valerie [name]  came in today for her Pneumovax injection.      Hiawatha Dressel A Tarnisha Kachmar 02/09/2024, 9:59 AM

## 2024-02-16 ENCOUNTER — Other Ambulatory Visit (HOSPITAL_COMMUNITY): Payer: Self-pay

## 2024-02-27 NOTE — Patient Instructions (Incomplete)
 Asthma  -Continue Breztri  2 puffs twice a day with spacer to help prevent cough and wheeze.  Rinse mouth after use.  Use Breztri  with spacer device. When you run out of Breztri  change to Trelegy 200 mcg 1 puff once a day. Rinse mouth out after -Continue Nucala  injections at home per protocol - May use albuterol   2 puffs every 4-6 hours as needed for cough, wheeze, tightness in chest, or shortness of breath   Asthma control goals:  Full participation in all desired activities (may need albuterol  before activity) Albuterol  use two time or less a week on average (not counting use with activity) Cough interfering with sleep two time or less a month Oral steroids no more than once a year No hospitalizations  Rhinitis, nonallergic/ chronic sinusitis   - previous environmental allergy testing has been negative by skin testing and serum IgE testinng   - continue taking daily antihistamine like Cetirizine  10mg  daily    - for nasal congestion/drainage continue Ryaltris  2 sprays each nostril twice a day as needed.  - Keep upcoming appointment with Dr. Tobie (ENT) on 03/15/24.  Consider sinus imaging.  Frequent infections -Get repeat strep pneumoniae titers on 03/22/24 -Keep track number of antibiotics and infections   Follow-up in 2 months with Dr. Jeneal or sooner if needed

## 2024-02-28 ENCOUNTER — Encounter: Payer: Self-pay | Admitting: Family

## 2024-02-28 ENCOUNTER — Ambulatory Visit (INDEPENDENT_AMBULATORY_CARE_PROVIDER_SITE_OTHER): Admitting: Family

## 2024-02-28 VITALS — BP 110/74 | HR 82 | Temp 98.7°F | Ht 65.0 in | Wt 149.3 lb

## 2024-02-28 DIAGNOSIS — L858 Other specified epidermal thickening: Secondary | ICD-10-CM | POA: Diagnosis not present

## 2024-02-28 DIAGNOSIS — Z8619 Personal history of other infectious and parasitic diseases: Secondary | ICD-10-CM | POA: Diagnosis not present

## 2024-02-28 DIAGNOSIS — B078 Other viral warts: Secondary | ICD-10-CM | POA: Diagnosis not present

## 2024-02-28 DIAGNOSIS — J329 Chronic sinusitis, unspecified: Secondary | ICD-10-CM | POA: Diagnosis not present

## 2024-02-28 DIAGNOSIS — J4550 Severe persistent asthma, uncomplicated: Secondary | ICD-10-CM

## 2024-02-28 MED ORDER — TRELEGY ELLIPTA 200-62.5-25 MCG/ACT IN AEPB: INHALATION_SPRAY | RESPIRATORY_TRACT | 5 refills | Status: AC

## 2024-02-28 NOTE — Progress Notes (Signed)
 [address] [place] [address] Dept: [phone]  FOLLOW UP NOTE  Patient ID: Valerie [name]. Riviere, female    DOB: 03/27/2001  Age: 23 y.o. MRN: [id number] Date of Office Visit: 02/28/2024  Assessment  Chief Complaint: Sinusitis (Pt is here for her follow up regarding immunology labs. She says her her sinus symptoms persist and she is following up with ENT regarding sinuplasty. She says she has some S.O.B. but no wheezing.)  HPI Valerie [name] is a 23 year old female who presents today for follow-up of frequent infections, not well-controlled severe persistent asthma, and chronic sinusitis.  She was last seen on January 17, 2024 by myself.  She denies any new diagnosis or surgery since her last office visit.  Severe persistent asthma: She continues to take Breztri  2 puffs twice a day every day with a spacer.  She reports that her insurance does not want to cover this medication any more.  Her insurance has paid for Symbicort  in the past before, but it was not as helpful as Breztri .  She did receive her first Nucala  injection at home 2 weeks ago and denies any problems or reactions.  She reports coughing that occurs more at work or when lying down.  She also has wheezing at night that is more like a whistle.  She also has tightness in her chest, shortness of breath, and nocturnal awakenings due to breathing problems.  She reports last night it was bad.  She feels like she will have dreams where she is drowning and not breathing and she will wake up not being able to breathe.  Since her last office visit she has not made any trips to the emergency room or urgent care due to breathing problems, but thought she was going to have to go 3 weeks ago, but then she used her inhaler and it helped.  She has not received any systemic steroids.  She uses her albuterol  3 times a week mainly when at work and it helps.  Chronic sinusitis/nonallergic rhinitis: She reports that when she was on the  antibiotic, Augmentin , that it helped for a couple days.  Then her rhinorrhea went back to green, now it is clear though.  She reports nasal congestion, postnasal drip, and sinus pressure.  She denies rhinorrhea.  She reports around 1-1/2 weeks ago she had a low-grade fever and she feels like she always feels like she is hot and chilling.  She does take cetirizine  daily and uses Ryaltris  nasal spray 2 sprays each nostril twice a day.  She is excited to see Dr. Tobie, ENT,  on October 23.  Frequent and factions: She did receive her Pneumovax on February 09, 2024.  She will come back to our office on October 30 for lab work.  She reports that she has not received any other antibiotics since we last saw her.   Drug Allergies:  Allergies  Allergen Reactions   Promethazine -Dm Shortness Of Breath    Review of Systems: Negative except as per HPI  Physical Exam: BP 110/74 (BP Location: Left Arm, Patient Position: Sitting, Cuff Size: Normal)   Pulse 82   Temp 98.7 F (37.1 C) (Temporal)   Ht 5' 5 (1.651 m)   Wt 149 lb 4.8 oz (67.7 kg)   SpO2 100%   BMI 24.84 kg/m    Physical Exam Constitutional:      Appearance: Normal appearance.  HENT:     Head: Normocephalic and atraumatic.     Comments: Pharynx  normal, eyes normal, ears normal, nose: Bilateral lower turbinates moderately edematous with white drainage noted    Right Ear: Tympanic membrane, ear canal and external ear normal.     Left Ear: Tympanic membrane, ear canal and external ear normal.     Mouth/Throat:     Mouth: Mucous membranes are moist.     Pharynx: Oropharynx is clear.  Eyes:     Conjunctiva/sclera: Conjunctivae normal.  Cardiovascular:     Rate and Rhythm: Regular rhythm.     Heart sounds: Normal heart sounds.  Pulmonary:     Effort: Pulmonary effort is normal.     Breath sounds: Normal breath sounds.     Comments: Lungs clear to auscultation Musculoskeletal:     Cervical back: Neck supple.  Skin:    General:  Skin is warm.  Neurological:     Mental Status: She is alert and oriented to person, place, and time.  Psychiatric:        Mood and Affect: Mood normal.        Behavior: Behavior normal.        Thought Content: Thought content normal.        Judgment: Judgment normal.     Diagnostics: FVC 3.55 L (93%), FEV1 3.01 L (92%), FEV1/FVC 0.85.  Spirometry indicates normal spirometry.  Spirometry is improved from last spirometry  Assessment and Plan: 1. Chronic sinusitis, unspecified location   2. Not well controlled severe persistent asthma (HCC)   3. Frequent infections     Meds ordered this encounter  Medications   Fluticasone -Umeclidin-Vilant (TRELEGY ELLIPTA) 200-62.5-25 MCG/ACT AEPB    Sig: One inhalation once a day. Rinse mouth out after    Dispense:  28 each    Refill:  5    Patient Instructions  Asthma  -Continue Breztri  2 puffs twice a day with spacer to help prevent cough and wheeze.  Rinse mouth after use.  Use Breztri  with spacer device. When you run out of Breztri  change to Trelegy 200 mcg 1 puff once a day. Rinse mouth out after -Continue Nucala  injections at home per protocol - May use albuterol   2 puffs every 4-6 hours as needed for cough, wheeze, tightness in chest, or shortness of breath   Asthma control goals:  Full participation in all desired activities (may need albuterol  before activity) Albuterol  use two time or less a week on average (not counting use with activity) Cough interfering with sleep two time or less a month Oral steroids no more than once a year No hospitalizations  Rhinitis, nonallergic/ chronic sinusitis   - previous environmental allergy testing has been negative by skin testing and serum IgE testinng   - continue taking daily antihistamine like Cetirizine  10mg  daily    - for nasal congestion/drainage continue Ryaltris  2 sprays each nostril twice a day as needed.  - Keep upcoming appointment with Dr. Tobie (ENT) on 03/15/24.  Consider sinus  imaging.  Frequent infections -Get repeat strep pneumoniae titers on 03/22/24 -Keep track number of antibiotics and infections   Follow-up in 2 months with Dr. Jeneal or sooner if needed  Return in about 2 months (around 04/29/2024), or if symptoms worsen or fail to improve.    Thank you for the opportunity to care for this patient.  Please do not hesitate to contact me with questions.  Wanda Craze, FNP [facility] 

## 2024-02-28 NOTE — Addendum Note (Signed)
 Addended by: TERESSA KNEE B on: 02/28/2024 04:30 PM   Modules accepted: Orders

## 2024-03-01 ENCOUNTER — Other Ambulatory Visit: Payer: Self-pay

## 2024-03-05 ENCOUNTER — Other Ambulatory Visit: Payer: Self-pay

## 2024-03-05 NOTE — Progress Notes (Signed)
 Specialty Pharmacy Refill Coordination Note  Makaela Cando. Mills is a 23 y.o. female contacted today regarding refills of specialty medication(s) Mepolizumab  (Nucala )   Patient requested Delivery   Delivery date: 03/13/24   Verified address: [address]  [place] [address]   Medication will be filled on 03/12/24.

## 2024-03-12 ENCOUNTER — Other Ambulatory Visit: Payer: Self-pay

## 2024-03-15 ENCOUNTER — Institutional Professional Consult (permissible substitution) (INDEPENDENT_AMBULATORY_CARE_PROVIDER_SITE_OTHER): Admitting: Otolaryngology

## 2024-04-02 ENCOUNTER — Other Ambulatory Visit: Payer: Self-pay

## 2024-04-03 ENCOUNTER — Ambulatory Visit (INDEPENDENT_AMBULATORY_CARE_PROVIDER_SITE_OTHER): Admitting: Otolaryngology

## 2024-04-03 ENCOUNTER — Encounter (INDEPENDENT_AMBULATORY_CARE_PROVIDER_SITE_OTHER): Payer: Self-pay | Admitting: Otolaryngology

## 2024-04-03 VITALS — BP 112/78 | HR 86 | Ht 65.0 in | Wt 150.0 lb

## 2024-04-03 DIAGNOSIS — J3489 Other specified disorders of nose and nasal sinuses: Secondary | ICD-10-CM | POA: Diagnosis not present

## 2024-04-03 DIAGNOSIS — J324 Chronic pansinusitis: Secondary | ICD-10-CM | POA: Diagnosis not present

## 2024-04-03 DIAGNOSIS — J343 Hypertrophy of nasal turbinates: Secondary | ICD-10-CM

## 2024-04-03 DIAGNOSIS — R0981 Nasal congestion: Secondary | ICD-10-CM | POA: Diagnosis not present

## 2024-04-03 DIAGNOSIS — J342 Deviated nasal septum: Secondary | ICD-10-CM

## 2024-04-03 DIAGNOSIS — J8283 Eosinophilic asthma: Secondary | ICD-10-CM

## 2024-04-03 DIAGNOSIS — J310 Chronic rhinitis: Secondary | ICD-10-CM

## 2024-04-03 MED ORDER — PREDNISONE 20 MG PO TABS: 20.0000 mg | ORAL_TABLET | Freq: Every day | ORAL | 0 refills | Status: AC

## 2024-04-03 MED ORDER — RYALTRIS 665-25 MCG/ACT NA SUSP: 2.0000 | Freq: Two times a day (BID) | NASAL | 5 refills | Status: AC

## 2024-04-03 NOTE — Patient Instructions (Addendum)
 Take 20mg  prednisone  for 5 days Continue ryaltris  two sprays twice daily I have ordered an imaging study for you to complete prior to your next visit. Please call [facility] Scheduling at [phone] to schedule your imaging if you have not received a call within 24 hours. If you are unable to complete your imaging study prior to your next scheduled visit please call our office to let us  know.  ---- please get CT in about 4 weeks, not now.

## 2024-04-03 NOTE — Progress Notes (Signed)
 Dear Dr. Cheryl, Here is my assessment for our mutual patient, Valerie [name]. Thank you for allowing me the opportunity to care for your patient. Please do not hesitate to contact me should you have any other questions. Sincerely, Dr. Eldora Blanch  Otolaryngology Clinic Note  HISTORY:  Initial visit (03/2024): Discussed the use of AI scribe software for clinical note transcription with the patient, who gave verbal consent to proceed.  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  She has experienced chronic sinus issues since the end of the COVID pandemic, with significant nasal congestion, especially at night, leading to dry mouth and difficulty breathing through her nose. There is constant nasal congestion with frequent green nasal discharge from both sides and post-nasal drip irritating her throat. She reports significant pressure from her cheeks extending over her forehead, which triggers her migraines. Her sense of smell has been impaired for about a year.  She is currently using Ryaltris  nasal spray and performs sinus rinses with a squeeze bottle approximately once a month, though these provide little relief. She takes cetirizine  daily. She has not undergone sinus surgery or had a CT scan of her sinuses. She is frequently on antibiotics, currently taking clindamycin after a dental procedure and was on antibiotics a week ago for a tooth extraction. She avoids steroids due to concerns about weight gain. Abx do seem to help, but symptoms persist. Between exacerbations, she continues to have nasal congestion. She is also on nucala  for severe asthma  Allergy testing has not been done. No previous sinonasal surgery. Currently on antibiotics for dental dry socket (Clindamycin). PO steroids last year, but tries to avoid (gains weight). Mom had cushing's disease.  No prior CT  Sinus  She saw Dr. Ethyl for this in 2021 but did not undergo nasal endo.  ASA sens: no  She is currently using Ryaltris , Zyrtec , Nucala . Nasal Rinses once per month  GLP-1: no AP/AC: no  Tobacco: former, quit  PMHx: Severe Asthma, GERD, Migraines, Hypothyroidism, MDD/GAD, OSA  RADIOGRAPHIC EVALUATION AND INDEPENDENT REVIEW OF OTHER RECORDS:: Wanda Cheryl (02/28/2024): noted persistent sinus infections/sinusitis in setting of severe asthma; on Nucala ; noted chronic sinusitis with improvement on augmentin , green drainage. Noted congestion, PND, sinus pressure; on ryaltris , zyrtec . Dx: Chronic sinusitis, Rhinitis; Rx: nucala , PO anthistamine, rylatris Labs CBC 01/17/2024: WBC 5.3, Eos 600 (elevated even prior); Ig testing ; 01/17/2024: wnl; Strep titers 01/17/2024: multiple lows, repeat testing pending; complement: WNL Allergy testing 02/15/18: RAST and skin prick: negative; IgE WNL; IgE 34  Past Medical History:  Diagnosis Date   Allergic rhinitis    Anxiety    Childhood asthma    Depression 05/19/2017   Migraine    Recurrent upper respiratory infection (URI)    History reviewed. No pertinent surgical history. Family History  Problem Relation Age of Onset   Arthritis Mother    Asthma Mother    Depression Mother    Hyperlipidemia Mother    Depression Father    Arthritis Maternal Grandmother    Bone cancer Maternal Grandmother    Heart disease Maternal Grandmother    Stroke Maternal Grandmother    Alcohol abuse Maternal Grandfather    COPD Maternal Grandfather    Cancer Maternal Grandfather    AAA (abdominal aortic aneurysm) Maternal Grandfather    Arthritis Paternal Grandmother    Depression Paternal Grandmother    Hypertension Paternal Grandmother  Arthritis Paternal Grandfather    Hyperlipidemia Paternal Grandfather    Social History   Tobacco Use   Smoking status: Former    Types: E-cigarettes    Passive exposure: Yes   Smokeless tobacco: Never   Tobacco  comments:    Tried vaping in high school for a short period of time  Substance Use Topics   Alcohol use: Yes    Comment: 0cc   Allergies  Allergen Reactions   Promethazine -Dm Shortness Of Breath   Current Outpatient Medications  Medication Sig Dispense Refill   albuterol  (VENTOLIN  HFA) 108 (90 Base) MCG/ACT inhaler Inhale 2 puffs into the lungs every 6 (six) hours as needed for wheezing or shortness of breath. 18 g 1   Atogepant  (QULIPTA ) 60 MG TABS 1 tab by mouth once daily 90 tablet 3   benzonatate  (TESSALON ) 100 MG capsule Take 1 capsule (100 mg total) by mouth every 8 (eight) hours as needed for cough. 21 capsule 0   cetirizine  (ZYRTEC ) 10 MG tablet Take 1 tablet (10 mg total) by mouth daily. 30 tablet 5   citalopram  (CELEXA ) 40 MG tablet Take 1 tablet (40 mg total) by mouth daily. 90 tablet 3   cyclobenzaprine  (FLEXERIL ) 5 MG tablet Take 1 tablet (5 mg total) by mouth 3 (three) times daily as needed. 40 tablet 1   Fluticasone -Umeclidin-Vilant (TRELEGY ELLIPTA) 200-62.5-25 MCG/ACT AEPB One inhalation once a day. Rinse mouth out after 28 each 5   [name] 24 FE 1-20 MG-MCG(24) tablet PLEASE SEE ATTACHED FOR DETAILED DIRECTIONS     levothyroxine  (SYNTHROID ) 50 MCG tablet Take 1 tablet (50 mcg total) by mouth daily. 90 tablet 3   Mepolizumab  (NUCALA ) 100 MG/ML SOAJ Inject 1 mL (100 mg total) into the skin every 28 (twenty-eight) days. 1 mL 11   predniSONE  (DELTASONE ) 20 MG tablet Take 1 tablet (20 mg total) by mouth daily with breakfast for 5 days. 5 tablet 0   rizatriptan  (MAXALT ) 10 MG tablet TAKE 1 TABLET BY MOUTH AS NEEDED FOR MIGRAINE. MAY REPEAT IN 2 HOURS IF NEEDED 8 tablet 5   amoxicillin -clavulanate (AUGMENTIN ) 875-125 MG tablet Take 1 tablet by mouth 2 (two) times daily. (Patient not taking: Reported on 04/03/2024) 14 tablet 0   dicyclomine  (BENTYL ) 20 MG tablet TAKE 1 TABLET (20 MG TOTAL) BY MOUTH 4 (FOUR) TIMES DAILY AS NEEDED FOR SPASMS. (Patient not taking: Reported on  04/03/2024) 360 tablet 1   HYDROcodone -acetaminophen  (NORCO) 7.5-325 MG tablet Take 1 tablet by mouth every 6 (six) hours as needed. (Patient not taking: Reported on 04/03/2024)     methylPREDNISolone  (MEDROL  DOSEPAK) 4 MG TBPK tablet 4 tab by mouth x 3 days, 2 tabs x 3 days, 1 tab x 3 days (Patient not taking: Reported on 04/03/2024) 21 tablet 0   naproxen  (NAPROSYN ) 500 MG tablet TAKE 1 TABLET TWICE A DAY BY ORAL ROUTE AS NEEDED. (Patient not taking: Reported on 04/03/2024)     norelgestromin-ethinyl estradiol (XULANE) 150-35 MCG/24HR transdermal patch APPLY 1 PATCH BY TRANSDERMAL ROUTE EVERY WEEK (Patient not taking: Reported on 04/03/2024)     Olopatadine-Mometasone (RYALTRIS ) 665-25 MCG/ACT SUSP Place 2 sprays into the nose 2 (two) times daily. 29 g 5   pantoprazole  (PROTONIX ) 40 MG tablet Take 1 tablet (40 mg total) by mouth daily. (Patient not taking: Reported on 04/03/2024) 90 tablet 3   Plecanatide  (TRULANCE ) 3 MG TABS Take one tab by mouth once daily (Patient not taking: Reported on 04/03/2024) 90 tablet 3   Spacer/Aero-Holding Raguel FRENCH  Use as directed with inhaler. (Patient not taking: Reported on 04/03/2024) 1 each 0   Current Facility-Administered Medications  Medication Dose Route Frequency Provider Last Rate Last Admin   mepolizumab  (NUCALA ) injection 100 mg  100 mg Subcutaneous Q28 days Jeneal Danita Macintosh, MD   100 mg at 04/14/22 1711   BP 112/78 (BP Location: Left Arm, Patient Position: Sitting, Cuff Size: Normal)   Pulse 86   Ht 5' 5 (1.651 m)   Wt 150 lb (68 kg)   SpO2 98%   BMI 24.96 kg/m   PHYSICAL EXAM:  BP 112/78 (BP Location: Left Arm, Patient Position: Sitting, Cuff Size: Normal)   Pulse 86   Ht 5' 5 (1.651 m)   Wt 150 lb (68 kg)   SpO2 98%   BMI 24.96 kg/m    Salient findings:  CN II-XII intact Bilateral EAC clear and TM intact with well pneumatized middle ear spaces Nose: Anterior rhinoscopy reveals septum modest deviation; bilateral inferior  turbinate hypertrophy which is moderately severe.  Nasal endoscopy was indicated to better evaluate the nose and paranasal sinuses, given the patient's history and exam findings, and is detailed below. No lesions of oral cavity/oropharynx No obviously palpable neck masses/lymphadenopathy/thyromegaly No respiratory distress or stridor   PROCEDURE:  Prior to initiating any procedures, risks/benefits/alternatives were explained to the patient and verbal consent obtained. Diagnostic Nasal Endoscopy Pre-procedure diagnosis: Concern for chronic sinusitis; nasal congestion Post-procedure diagnosis: same Indication: See pre-procedure diagnosis and physical exam above Complications: None apparent EBL: 0 mL Anesthesia: Lidocaine 4% and topical decongestant was topically sprayed in each nasal cavity  Description of Procedure:  Patient was identified. A rigid 30 degree endoscope was utilized to evaluate the sinonasal cavities, mucosa, sinus ostia and turbinates and septum.  Overall, signs of mucosal inflammation are noted - fairly significant global mucosal edema.  No mucopurulence, polyps, or masses noted.   Right Middle meatus: modest mucoid secretions Right SE Recess: same as right MM Left MM: modest mucoid secretions Left SE Recess: same as right MM Photodocumentation was obtained.  CPT CODE -- 31231 - Mod 25   ASSESSMENT:  23 y.o.  1. Chronic pansinusitis   2. Hypertrophy of both inferior nasal turbinates   3. Nasal septal deviation   4. Nasal obstruction   5. Nasal congestion   6. Eosinophilic asthma   7. Non-allergic eosinophilic rhinitis (NARES)    Severe asthma, on nucala . Eos 600. Likely multifactorial. Endo with fairly significant mucosal edema. We discussed options and it makes most sense given symptoms (and currently on abx) to do short pred course given she is already on medical management and obtain post-treatment CT. She is in agreement  PLAN: We've discussed issues and  options today.  We reviewed the nasal endoscopy images together.  The risks, benefits and alternatives were discussed and questions answered.  She has elected to proceed with:  1) Finish antibiotics/clinda 2) Prednisone  burst 3) Ryaltris  BID 4) Daily nasal rinses 5) Post treatment sinus CT  F/u in 6 weeks with CT  See below regarding exact medications prescribed this encounter including dosages and route: Meds ordered this encounter  Medications   predniSONE  (DELTASONE ) 20 MG tablet    Sig: Take 1 tablet (20 mg total) by mouth daily with breakfast for 5 days.    Dispense:  5 tablet    Refill:  0   Olopatadine-Mometasone (RYALTRIS ) 665-25 MCG/ACT SUSP    Sig: Place 2 sprays into the nose 2 (two) times daily.  Dispense:  29 g    Refill:  5    Patient mobile: [phone]     Thank you for allowing me the opportunity to care for your patient. Please do not hesitate to contact me should you have any other questions.  Sincerely, Eldora Blanch, MD Otolaryngologist (ENT), [facility] Phone: [phone] Fax: [phone]  MDM:  Level 4: [id number] Complexity/Problems addressed: mod - multiple chronic problems Data complexity: mod - independent review of notes, labs, ordering test - Morbidity: mod  - Prescription Drug prescribed or managed: y  04/08/2024, 9:05 AM

## 2024-04-04 ENCOUNTER — Other Ambulatory Visit: Payer: Self-pay

## 2024-04-06 ENCOUNTER — Other Ambulatory Visit: Payer: Self-pay

## 2024-04-09 DIAGNOSIS — Z113 Encounter for screening for infections with a predominantly sexual mode of transmission: Secondary | ICD-10-CM | POA: Diagnosis not present

## 2024-04-09 DIAGNOSIS — L723 Sebaceous cyst: Secondary | ICD-10-CM | POA: Diagnosis not present

## 2024-04-20 ENCOUNTER — Other Ambulatory Visit: Payer: Self-pay | Admitting: Internal Medicine

## 2024-04-26 ENCOUNTER — Ambulatory Visit
Admission: RE | Admit: 2024-04-26 | Discharge: 2024-04-26 | Disposition: A | Source: Ambulatory Visit | Attending: Otolaryngology

## 2024-04-26 DIAGNOSIS — J3489 Other specified disorders of nose and nasal sinuses: Secondary | ICD-10-CM

## 2024-04-26 DIAGNOSIS — J342 Deviated nasal septum: Secondary | ICD-10-CM

## 2024-04-26 DIAGNOSIS — R0981 Nasal congestion: Secondary | ICD-10-CM

## 2024-04-26 DIAGNOSIS — J343 Hypertrophy of nasal turbinates: Secondary | ICD-10-CM

## 2024-04-26 DIAGNOSIS — J324 Chronic pansinusitis: Secondary | ICD-10-CM

## 2024-05-15 ENCOUNTER — Ambulatory Visit: Payer: Self-pay

## 2024-05-15 ENCOUNTER — Ambulatory Visit: Admitting: Family Medicine

## 2024-05-15 NOTE — Telephone Encounter (Signed)
 FYI Only or Action Required?: FYI only for provider: appointment scheduled on 05/15/24.  Patient was last seen in primary care on 01/24/2024 by Norleen Lynwood ORN, MD.  Called Nurse Triage reporting Cough.  Symptoms began about a month ago. Worsening for several days  Interventions attempted: OTC medications: Sinus, Rest, hydration, or home remedies, and Other: warm moist heat.  Symptoms are: gradually worsening.  Triage Disposition: See HCP Within 4 Hours (Or PCP Triage)  Patient/caregiver understands and will follow disposition?: Yes    Copied from CRM [id number]. Topic: Clinical - Red Word Triage >> May 15, 2024  8:04 AM Treva T wrote: [facility] that prompted transfer to Nurse Triage: Pt Calling, states she is having productive coughing, greenish yellow thick, lumpy colored mucuos,  sinus headache, respiratory issues, and has a lung disease, and she thinks may have another respiratory infection.   Pt also reports tightness in her chest with heartburn, and some shortness of breath.  Pt is requesting an appt for evaluation.   Ph. [phone] Reason for Disposition  [1] Redness or swelling on the cheek, forehead or around the eye AND [2] no fever  Answer Assessment - Initial Assessment Questions Additional info: History of sinus problems. History of eosinophilic asthma One month ago started with sinus symptoms, that progressed to sore throat and cough, thick post nasal drip. She feels this has turned into an upper respiratory or sinus infection.  CT Scan early December showed partial sinus blockage. Surgery scheduled end of December.   1. LOCATION: Where does it hurt?      Pressure on face and head 2. ONSET: When did the sinus pain start?  (e.g., hours, days)      One month ago 3. SEVERITY: How bad is the pain?   (Scale 0-10; or none, mild, moderate or severe)     Mild-moderate 4. RECURRENT SYMPTOM: Have you ever had sinus problems before? If Yes, ask: When was the last  time? and What happened that time?      yes 5. NASAL CONGESTION: Is the nose blocked? If Yes, ask: Can you open it or must you breathe through your mouth?     yes 6. NASAL DISCHARGE: Do you have discharge from your nose? If so ask, What color?      7. FEVER: Do you have a fever? If Yes, ask: What is it, how was it measured, and when did it start?      Denies had low grade last week but resolved 99.1-99.5 8. OTHER SYMPTOMS: Do you have any other symptoms? (e.g., sore throat, cough, earache, difficulty breathing)     Congestion, heartburn for two night feels the same as usual heartburn, mild swelling under eyes is persistent. Denies breathing difficulty. Heartburn X 2 nights, denies chest pain. Denies all other symptoms.  Protocols used: Sinus Pain or Congestion-A-AH

## 2024-05-15 NOTE — Progress Notes (Deleted)
" ° °  Acute Office Visit  Subjective:     Patient ID: Valerie [name], female    DOB: 03-Mar-2001, 23 y.o.   MRN: [id number]  No chief complaint on file.   HPI  Discussed the use of AI scribe software for clinical note transcription with the patient, who gave verbal consent to proceed.  History of Present Illness      ROS Per HPI      Objective:    There were no vitals taken for this visit.   Physical Exam Vitals and nursing note reviewed.  Constitutional:      General: She is not in acute distress.    Appearance: Normal appearance. She is normal weight.  HENT:     Head: Normocephalic and atraumatic.     Right Ear: External ear normal.     Left Ear: External ear normal.     Nose: Nose normal.     Mouth/Throat:     Mouth: Mucous membranes are moist.     Pharynx: Oropharynx is clear.  Eyes:     Extraocular Movements: Extraocular movements intact.     Pupils: Pupils are equal, round, and reactive to light.  Cardiovascular:     Rate and Rhythm: Normal rate and regular rhythm.     Pulses: Normal pulses.     Heart sounds: Normal heart sounds.  Pulmonary:     Effort: Pulmonary effort is normal. No respiratory distress.     Breath sounds: Normal breath sounds. No wheezing, rhonchi or rales.  Musculoskeletal:        General: Normal range of motion.     Cervical back: Normal range of motion.     Right lower leg: No edema.     Left lower leg: No edema.  Lymphadenopathy:     Cervical: No cervical adenopathy.  Neurological:     General: No focal deficit present.     Mental Status: She is alert and oriented to person, place, and time.  Psychiatric:        Mood and Affect: Mood normal.        Thought Content: Thought content normal.     No results found for any visits on 05/15/24.      Assessment & Plan:   Assessment and Plan Assessment & Plan      No orders of the defined types were placed in this encounter.    No orders of the defined types were  placed in this encounter.   No follow-ups on file.  Corean LITTIE Ku, FNP  "

## 2024-05-18 ENCOUNTER — Encounter: Payer: Self-pay | Admitting: Family Medicine

## 2024-05-18 ENCOUNTER — Telehealth: Admitting: Family Medicine

## 2024-05-18 ENCOUNTER — Ambulatory Visit: Admitting: Family Medicine

## 2024-05-18 DIAGNOSIS — R051 Acute cough: Secondary | ICD-10-CM

## 2024-05-18 DIAGNOSIS — K2100 Gastro-esophageal reflux disease with esophagitis, without bleeding: Secondary | ICD-10-CM

## 2024-05-18 DIAGNOSIS — G43809 Other migraine, not intractable, without status migrainosus: Secondary | ICD-10-CM | POA: Diagnosis not present

## 2024-05-18 NOTE — Patient Instructions (Addendum)
 Take Protonix  in the morning and Pepcid at bedtime.   Avoid eating and laying down for at least 3 hours.   Avoid foods or beverages that are acidic or worsen your acid reflux.   Schedule with the gastroenterologist as recommended  Follow-up with your ENT on Monday

## 2024-05-18 NOTE — Telephone Encounter (Signed)
 LM for pt to call me back and we can see her sooner if shed like

## 2024-05-18 NOTE — Progress Notes (Signed)
 "   MyChart Video Visit    Virtual Visit via Video Note    Patient location: Home. Patient and provider in visit Provider location: Office 2 patient identifiers were used  I discussed the limitations of evaluation and management by telemedicine and the availability of in person appointments. The patient expressed understanding and agreed to proceed.  Visit Date: 05/18/2024  Today's healthcare provider: Boby Mackintosh, NP-C     Subjective:    Patient ID: Valerie [name]. Harkey, female    DOB: [birth year], 23 y.o.   MRN: [id number]  Chief Complaint  Patient presents with   Cough    Unable to sleep due to coughing so much. Cough over a week. Throat irritated due to mucus and cough    Cough     Discussed the use of AI scribe software for clinical note transcription with the patient, who gave verbal consent to proceed.  History of Present Illness Valerie [name] is a 23 year old female who presents with nighttime coughing and throat irritation.  Nocturnal cough and throat irritation - Frequent cough during sleep with less daytime cough - Cough sometimes productive of mucus - Nighttime cough associated with throat pain and irritation, previously described as feeling like 'swallowed razor blades', now improving - Suspects sinus drainage as a contributing factor  Chronic sinus congestion and migraine - Chronic sinus problems with sinus pressure - Sinus pressure can trigger migraines - Migraines treated with rizatriptan  - Awaiting ENT input on CT sinus results and possible balloon sinuplasty - Insurance does not cover Nurtec, which was recommended for migraine prevention  Heartburn and chest symptoms - Intermittent chest tightness and pain associated with heartburn - Pantoprazole  40 mg once daily started three days ago with improvement in symptoms - Previously prescribed pantoprazole  last year but was not taking it regularly - Manages heartburn by sleeping propped up and  on her left side - Has a GI referral and plans to schedule  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  Family History  Problem Relation Age of Onset   Arthritis Mother    Asthma Mother    Depression Mother    Hyperlipidemia Mother    Depression Father    Arthritis Maternal Grandmother    Bone cancer Maternal Grandmother    Heart disease Maternal Grandmother    Stroke Maternal Grandmother    Alcohol abuse Maternal Grandfather    COPD Maternal Grandfather    Cancer Maternal Grandfather    AAA (abdominal aortic aneurysm) Maternal Grandfather    Arthritis Paternal Grandmother    Depression Paternal Grandmother    Hypertension Paternal Grandmother    Arthritis Paternal Grandfather    Hyperlipidemia Paternal Grandfather     Social History   Socioeconomic History   Marital status: Single    Spouse name: Not on file   Number of children: Not on file   Years of education: Not on file   Highest education level: Some college, no degree  Occupational History   Not on file  Tobacco Use   Smoking status: Former    Types: E-cigarettes    Passive exposure: Yes   Smokeless tobacco: Never  Tobacco comments:    Tried vaping in high school for a short period of time  Vaping Use   Vaping status: Former   Devices: 3 years ago, for about 2 months and then she quit  Substance and Sexual Activity   Alcohol use: Yes    Comment: 0cc   Drug use: No   Sexual activity: Never    Birth control/protection: None  Other Topics Concern   Not on file  Social History Narrative   Not on file   Social Drivers of Health   Tobacco Use: Medium  Risk (04/03/2024)   Patient History    Smoking Tobacco Use: Former    Smokeless Tobacco Use: Never    Passive Exposure: Yes  Physicist, Medical Strain: Medium Risk (08/19/2022)   Overall Financial Resource Strain (CARDIA)    Difficulty of Paying Living Expenses: Somewhat hard  Food Insecurity: Food Insecurity Present (08/19/2022)   Hunger Vital Sign    Worried About Running Out of Food in the Last Year: Sometimes true    Ran Out of Food in the Last Year: Sometimes true  Transportation Needs: No Transportation Needs (08/19/2022)   PRAPARE - Administrator, Civil Service (Medical): No    Lack of Transportation (Non-Medical): No  Physical Activity: Sufficiently Active (08/19/2022)   Exercise Vital Sign    Days of Exercise per Week: 3 days    Minutes of Exercise per Session: 80 min  Stress: Stress Concern Present (08/19/2022)   [employer] of Occupational Health - Occupational Stress Questionnaire    Feeling of Stress : Rather much  Social Connections: Socially Isolated (08/19/2022)   Social Connection and Isolation Panel    Frequency of Communication with Friends and Family: More than three times a week    Frequency of Social Gatherings with Friends and Family: More than three times a week    Attends Religious Services: Never    Database Administrator or Organizations: No    Attends Engineer, Structural: Not on file    Marital Status: Never married  Intimate Partner Violence: Not on file  Depression (PHQ2-9): Low Risk (11/09/2023)   Depression (PHQ2-9)    PHQ-2 Score: 0  Alcohol Screen: Low Risk (08/19/2022)   Alcohol Screen    Last Alcohol Screening Score (AUDIT): 2  Housing: Low Risk (08/19/2022)   Housing    Last Housing Risk Score: 0  Utilities: Not on file  Health Literacy: Not on file    Outpatient Medications Prior to Visit  Medication Sig Dispense Refill   albuterol  (VENTOLIN  HFA) 108 (90 Base) MCG/ACT inhaler Inhale 2 puffs into the lungs every 6  (six) hours as needed for wheezing or shortness of breath. 18 g 1   Atogepant  (QULIPTA ) 60 MG TABS 1 tab by mouth once daily 90 tablet 3   cetirizine  (ZYRTEC ) 10 MG tablet Take 1 tablet (10 mg total) by mouth daily. 30 tablet 5   citalopram  (CELEXA ) 40 MG tablet Take 1 tablet (40 mg total) by mouth daily. 90 tablet 3   cyclobenzaprine  (FLEXERIL ) 5 MG tablet Take 1 tablet (5 mg total) by mouth 3 (three) times daily as needed. 40 tablet 1   Fluticasone -Umeclidin-Vilant (TRELEGY ELLIPTA ) 200-62.5-25 MCG/ACT AEPB One inhalation once a day. Rinse mouth out after 28 each 5   [name] 24 FE 1-20 MG-MCG(24) tablet PLEASE SEE ATTACHED FOR DETAILED DIRECTIONS     levothyroxine  (SYNTHROID ) 50 MCG tablet Take 1 tablet (50 mcg total) by mouth daily. 90  tablet 3   Mepolizumab  (NUCALA ) 100 MG/ML SOAJ Inject 1 mL (100 mg total) into the skin every 28 (twenty-eight) days. 1 mL 11   Olopatadine-Mometasone (RYALTRIS ) 665-25 MCG/ACT SUSP Place 2 sprays into the nose 2 (two) times daily. 29 g 5   rizatriptan  (MAXALT ) 10 MG tablet TAKE 1 TABLET BY MOUTH AS NEEDED FOR MIGRAINE. MAY REPEAT IN 2 HOURS IF NEEDED 8 tablet 5   dicyclomine  (BENTYL ) 20 MG tablet TAKE 1 TABLET (20 MG TOTAL) BY MOUTH 4 (FOUR) TIMES DAILY AS NEEDED FOR SPASMS. (Patient not taking: Reported on 05/18/2024) 360 tablet 1   naproxen  (NAPROSYN ) 500 MG tablet TAKE 1 TABLET TWICE A DAY BY ORAL ROUTE AS NEEDED. (Patient not taking: Reported on 05/18/2024)     norelgestromin-ethinyl estradiol (XULANE) 150-35 MCG/24HR transdermal patch APPLY 1 PATCH BY TRANSDERMAL ROUTE EVERY WEEK (Patient not taking: Reported on 05/18/2024)     pantoprazole  (PROTONIX ) 40 MG tablet Take 1 tablet (40 mg total) by mouth daily. (Patient not taking: Reported on 05/18/2024) 90 tablet 3   Plecanatide  (TRULANCE ) 3 MG TABS Take one tab by mouth once daily (Patient not taking: Reported on 05/18/2024) 90 tablet 3   Spacer/Aero-Holding Chambers DEVI Use as directed with inhaler. (Patient  not taking: Reported on 05/18/2024) 1 each 0   Facility-Administered Medications Prior to Visit  Medication Dose Route Frequency Provider Last Rate Last Admin   mepolizumab  (NUCALA ) injection 100 mg  100 mg Subcutaneous Q28 days Jeneal Danita Macintosh, MD   100 mg at 04/14/22 1711    Allergies[1]  Review of Systems  Respiratory:  Positive for cough.    Per HPI    Objective:    Physical Exam Constitutional:      General: She is not in acute distress.    Appearance: She is not ill-appearing.  Pulmonary:     Effort: Pulmonary effort is normal.     Comments: Speaking in complete sentences without difficulty Musculoskeletal:     Cervical back: Normal range of motion and neck supple.  Neurological:     General: No focal deficit present.     Mental Status: She is alert and oriented to person, place, and time.  Psychiatric:        Mood and Affect: Mood normal.        Behavior: Behavior normal.        Thought Content: Thought content normal.     There were no vitals taken for this visit. Wt Readings from Last 3 Encounters:  04/03/24 150 lb (68 kg)  02/28/24 149 lb 4.8 oz (67.7 kg)  01/24/24 143 lb (64.9 kg)       Assessment & Plan:   Problem List Items Addressed This Visit     Acute cough   GERD (gastroesophageal reflux disease) - Primary   Migraine    I am having Delainie R. Pemberton maintain her Spacer/Aero-Holding Chambers, cyclobenzaprine , [name] 24 Fe, norelgestromin-ethinyl estradiol, naproxen , dicyclomine , citalopram , pantoprazole , levothyroxine , albuterol , cetirizine , Trulance , Qulipta , Nucala , Trelegy Ellipta , Ryaltris , and rizatriptan . We will continue to administer mepolizumab .  No orders of the defined types were placed in this encounter.  Assessment and Plan Assessment & Plan Gastroesophageal reflux disease with esophagitis Chronic GERD with esophagitis, presenting with nocturnal cough, throat irritation, and occasional chest pain. Symptoms improved  with pantoprazole . Acid reflux likely contributing to cough and throat irritation. No signs of infection. Discussed acid reflux's role in triggering asthma and coughing, especially at night. - Continue pantoprazole  40 mg daily in the morning. -  Start famotidine (Pepcid) 30-60 minutes before bedtime. - Avoid eating 3 hours before lying down and drinking 2 hours before lying down. - Avoid spicy foods and acidic beverages. - Prop up head and body when lying down - Consider guaifenesin  without dextromethorphan for mucus thinning. - Follow up with gastroenterology for further evaluation.  Acute cough Cough primarily nocturnal, likely secondary to GERD and sinus drainage. No signs of infection. Improvement noted with pantoprazole  use. - Manage GERD as outlined above to reduce cough. - Follow up with ENT for sinus evaluation and potential sinuplasty.  Migraine Chronic migraines with sinus pressure as a trigger. Current treatment with rizatriptan  is insufficient. Insurance issues with Nurtec. Tylenol  extra strength not effective. Discussed potential use of NSAIDs like Aleve  or ibuprofen, with caution due to gastrointestinal irritation. - Consider NSAIDs like Aleve  or ibuprofen for pain relief, with caution due to potential gastrointestinal irritation. - Follow up with ENT for further evaluation of sinus-related migraine triggers.    I discussed the assessment and treatment plan with the patient. The patient was provided an opportunity to ask questions and all were answered. The patient agreed with the plan and demonstrated an understanding of the instructions.   The patient was advised to call back or seek an in-person evaluation if the symptoms worsen or if the condition fails to improve as anticipated.    Boby Mackintosh, NP-C [facility] [place] [phone] (phone) [phone] (fax)  [facility]      [1]  Allergies Allergen Reactions    Promethazine -Dm Shortness Of Breath   "

## 2024-05-21 ENCOUNTER — Ambulatory Visit (INDEPENDENT_AMBULATORY_CARE_PROVIDER_SITE_OTHER): Admitting: Otolaryngology

## 2024-05-21 ENCOUNTER — Encounter (INDEPENDENT_AMBULATORY_CARE_PROVIDER_SITE_OTHER): Payer: Self-pay | Admitting: Otolaryngology

## 2024-05-21 DIAGNOSIS — J310 Chronic rhinitis: Secondary | ICD-10-CM | POA: Diagnosis not present

## 2024-05-21 DIAGNOSIS — J324 Chronic pansinusitis: Secondary | ICD-10-CM | POA: Diagnosis not present

## 2024-05-21 DIAGNOSIS — J343 Hypertrophy of nasal turbinates: Secondary | ICD-10-CM

## 2024-05-21 DIAGNOSIS — Z87891 Personal history of nicotine dependence: Secondary | ICD-10-CM

## 2024-05-21 DIAGNOSIS — J8283 Eosinophilic asthma: Secondary | ICD-10-CM

## 2024-05-21 DIAGNOSIS — R0981 Nasal congestion: Secondary | ICD-10-CM | POA: Diagnosis not present

## 2024-05-21 DIAGNOSIS — J342 Deviated nasal septum: Secondary | ICD-10-CM | POA: Diagnosis not present

## 2024-05-21 DIAGNOSIS — J3489 Other specified disorders of nose and nasal sinuses: Secondary | ICD-10-CM | POA: Diagnosis not present

## 2024-05-21 NOTE — Progress Notes (Signed)
 Dear Dr. Norleen, Here is my assessment for our mutual patient, Valerie [name]. Thank you for allowing me the opportunity to care for your patient. Please do not hesitate to contact me should you have any other questions. Sincerely, Dr. Eldora Blanch  Otolaryngology Clinic Note  HISTORY:  Initial visit (03/2024): Discussed the use of AI scribe software for clinical note transcription with the patient, who gave verbal consent to proceed.  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  She has experienced chronic sinus issues since the end of the COVID pandemic, with significant nasal congestion, especially at night, leading to dry mouth and difficulty breathing through her nose. There is constant nasal congestion with frequent green nasal discharge from both sides and post-nasal drip irritating her throat. She reports significant pressure from her cheeks extending over her forehead, which triggers her migraines. Her sense of smell has been impaired for about a year.  She is currently using Ryaltris  nasal spray and performs sinus rinses with a squeeze bottle approximately once a month, though these provide little relief. She takes cetirizine  daily. She has not undergone sinus surgery or had a CT scan of her sinuses. She is frequently on antibiotics, currently taking clindamycin after a dental procedure and was on antibiotics a week ago for a tooth extraction. She avoids steroids due to concerns about weight gain. Abx do seem to help, but symptoms persist. Between exacerbations, she continues to have nasal congestion. She is also on nucala  for severe asthma  Allergy testing has not been done. No previous sinonasal surgery. Currently on antibiotics for dental dry socket (Clindamycin). PO steroids last year, but tries to avoid (gains weight). Mom had cushing's disease.  No prior CT  Sinus  She saw Dr. Ethyl for this in 2021 but did not undergo nasal endo.  ASA sens: no  She is currently using Ryaltris , Zyrtec , Nucala . Nasal Rinses once per month  --------------------------------------------------------- 05/21/2024 Seen in follow up. We discussed her CT and pathology. Still on medical regimen. Abx/steroids helped, but temporarily. Feels like her symptoms are both migraine and nasal mediated.   GLP-1: no AP/AC: no  Tobacco: former, quit  PMHx: Severe Asthma, GERD, Migraines, Hypothyroidism, MDD/GAD, OSA  RADIOGRAPHIC EVALUATION AND INDEPENDENT REVIEW OF OTHER RECORDS:: Wanda Craze (02/28/2024): noted persistent sinus infections/sinusitis in setting of severe asthma; on Nucala ; noted chronic sinusitis with improvement on augmentin , green drainage. Noted congestion, PND, sinus pressure; on ryaltris , zyrtec . Dx: Chronic sinusitis, Rhinitis; Rx: nucala , PO anthistamine, rylatris Labs CBC 01/17/2024: WBC 5.3, Eos 600 (elevated even prior); Ig testing ; 01/17/2024: wnl; Strep titers 01/17/2024: multiple lows, repeat testing pending; complement: WNL Allergy testing 02/15/18: RAST and skin prick: negative; IgE WNL; IgE 34 CT Face (04/26/2024) independently interpreted: chronic pansinus (essentially) opacification with mild right NSD with bilateral ITH: SE recess opacified.  Past Medical History:  Diagnosis Date   Allergic rhinitis    Anxiety    Childhood asthma    Depression 05/19/2017   Migraine    Recurrent upper respiratory infection (URI)    History reviewed. No pertinent surgical history. Family History  Problem Relation Age of Onset   Arthritis Mother    Asthma Mother    Depression Mother    Hyperlipidemia Mother    Depression Father    Arthritis Maternal Grandmother    Bone cancer Maternal Grandmother    Heart disease Maternal Grandmother    Stroke  Maternal Grandmother    Alcohol abuse Maternal Grandfather    COPD Maternal Grandfather    Cancer  Maternal Grandfather    AAA (abdominal aortic aneurysm) Maternal Grandfather    Arthritis Paternal Grandmother    Depression Paternal Grandmother    Hypertension Paternal Grandmother    Arthritis Paternal Grandfather    Hyperlipidemia Paternal Grandfather    Social History   Tobacco Use   Smoking status: Former    Types: E-cigarettes    Passive exposure: Yes   Smokeless tobacco: Never   Tobacco comments:    Tried vaping in high school for a short period of time  Substance Use Topics   Alcohol use: Yes    Comment: 0cc   Allergies  Allergen Reactions   Promethazine -Dm Shortness Of Breath   Current Outpatient Medications  Medication Sig Dispense Refill   albuterol  (VENTOLIN  HFA) 108 (90 Base) MCG/ACT inhaler Inhale 2 puffs into the lungs every 6 (six) hours as needed for wheezing or shortness of breath. 18 g 1   Atogepant  (QULIPTA ) 60 MG TABS 1 tab by mouth once daily 90 tablet 3   cetirizine  (ZYRTEC ) 10 MG tablet Take 1 tablet (10 mg total) by mouth daily. 30 tablet 5   citalopram  (CELEXA ) 40 MG tablet Take 1 tablet (40 mg total) by mouth daily. 90 tablet 3   cyclobenzaprine  (FLEXERIL ) 5 MG tablet Take 1 tablet (5 mg total) by mouth 3 (three) times daily as needed. 40 tablet 1   dicyclomine  (BENTYL ) 20 MG tablet TAKE 1 TABLET (20 MG TOTAL) BY MOUTH 4 (FOUR) TIMES DAILY AS NEEDED FOR SPASMS. (Patient not taking: Reported on 05/18/2024) 360 tablet 1   Fluticasone -Umeclidin-Vilant (TRELEGY ELLIPTA ) 200-62.5-25 MCG/ACT AEPB One inhalation once a day. Rinse mouth out after 28 each 5   [name] 24 FE 1-20 MG-MCG(24) tablet PLEASE SEE ATTACHED FOR DETAILED DIRECTIONS     levothyroxine  (SYNTHROID ) 50 MCG tablet Take 1 tablet (50 mcg total) by mouth daily. 90 tablet 3   Mepolizumab  (NUCALA ) 100 MG/ML SOAJ Inject 1 mL (100 mg total) into the skin every 28 (twenty-eight) days. 1 mL 11   naproxen  (NAPROSYN ) 500 MG tablet TAKE 1 TABLET TWICE A DAY BY ORAL ROUTE AS NEEDED. (Patient not taking:  Reported on 05/18/2024)     norelgestromin-ethinyl estradiol (XULANE) 150-35 MCG/24HR transdermal patch APPLY 1 PATCH BY TRANSDERMAL ROUTE EVERY WEEK (Patient not taking: Reported on 05/18/2024)     Olopatadine-Mometasone (RYALTRIS ) 665-25 MCG/ACT SUSP Place 2 sprays into the nose 2 (two) times daily. 29 g 5   pantoprazole  (PROTONIX ) 40 MG tablet Take 1 tablet (40 mg total) by mouth daily. (Patient not taking: Reported on 05/18/2024) 90 tablet 3   Plecanatide  (TRULANCE ) 3 MG TABS Take one tab by mouth once daily (Patient not taking: Reported on 05/18/2024) 90 tablet 3   rizatriptan  (MAXALT ) 10 MG tablet TAKE 1 TABLET BY MOUTH AS NEEDED FOR MIGRAINE. MAY REPEAT IN 2 HOURS IF NEEDED 8 tablet 5   Spacer/Aero-Holding [facility] Use as directed with inhaler. (Patient not taking: Reported on 05/18/2024) 1 each 0   Current Facility-Administered Medications  Medication Dose Route Frequency Provider Last Rate Last Admin   mepolizumab  (NUCALA ) injection 100 mg  100 mg Subcutaneous Q28 days Jeneal Danita Macintosh, MD   100 mg at 04/14/22 1711   There were no vitals taken for this visit.  PHYSICAL EXAM:  There were no vitals taken for this visit.   Salient findings:  CN II-XII intact Nose: Anterior  rhinoscopy reveals septum modest deviation; bilateral inferior turbinate hypertrophy which is moderately severe No lesions of oral cavity/oropharynx No obviously palpable neck masses/lymphadenopathy/thyromegaly No respiratory distress or stridor   PROCEDURE (Prior, not today):  Prior to initiating any procedures, risks/benefits/alternatives were explained to the patient and verbal consent obtained. Diagnostic Nasal Endoscopy Pre-procedure diagnosis: Concern for chronic sinusitis; nasal congestion Post-procedure diagnosis: same Indication: See pre-procedure diagnosis and physical exam above Complications: None apparent EBL: 0 mL Anesthesia: Lidocaine 4% and topical decongestant was topically  sprayed in each nasal cavity  Description of Procedure:  Patient was identified. A rigid 30 degree endoscope was utilized to evaluate the sinonasal cavities, mucosa, sinus ostia and turbinates and septum.  Overall, signs of mucosal inflammation are noted - fairly significant global mucosal edema.  No mucopurulence, polyps, or masses noted.   Right Middle meatus: modest mucoid secretions Right SE Recess: same as right MM Left MM: modest mucoid secretions Left SE Recess: same as right MM Photodocumentation was obtained.  CPT CODE -- 31231 - Mod 25   ASSESSMENT:  23 y.o.  1. Chronic pansinusitis   2. Hypertrophy of both inferior nasal turbinates   3. Nasal septal deviation   4. Eosinophilic asthma   5. Nasal congestion   6. Nasal obstruction   7. Non-allergic eosinophilic rhinitis (NARES)    Severe asthma, on nucala . Eos 600. Likely multifactorial. Endo with fairly significant mucosal edema and CT with pansinus MPT Does have h/o migraines which may also contribute but clear MPT on CT and abx/steroid responsive  As such, we discussed options including septo/turbs/FESS  We discussed the goals of sinus surgery, and expectations for postoperative management. We discussed R/B/A including pain, infection, bleeding (~2% risk of operative visit for control), persistent symptoms, need for revision surgery, and other risks including damage to the eye, and injury to skull base with risk of CSF leak (<1%), anesthetic complications, among others.  Patient understands and is ready to proceed.  We discussed that this is a chronic condition and she will need topical therapy for management. Goal is to improve her sx but may not resolve. In addition, there is likely a migraine component as well which may contribute.   PLAN: We've discussed issues and options today.  We reviewed the nasal endoscopy images together.  The risks, benefits and alternatives were discussed and questions answered.  She has  elected to proceed with:  - Continue Ryaltris  BID - Encourage Daily nasal rinses - Schedule for septo/turb/full b/l FESS  F/u 1 week preop for abx/steroids by phone; f/u POD 5  See below regarding exact medications prescribed this encounter including dosages and route: No orders of the defined types were placed in this encounter.    Thank you for allowing me the opportunity to care for your patient. Please do not hesitate to contact me should you have any other questions.  Sincerely, Eldora Blanch, MD Otolaryngologist (ENT), [facility] Phone: [phone] Fax: [phone]  MDM:  [phone]; extensive counseling I have personally spent 40 minutes involved in face-to-face and non-face-to-face activities for this patient on the day of the visit.  Professional time spent excludes any procedures performed but includes the following activities, in addition to those noted in the documentation: preparing to see the patient (review of outside documentation and results), performing a medically appropriate examination, extensive counseling given multifactorial nature of sx, documenting in the electronic health record, independently interpreting results (CT).   05/21/2024, 6:25 PM

## 2024-06-20 ENCOUNTER — Ambulatory Visit: Admitting: Allergy

## 2024-07-23 ENCOUNTER — Encounter: Payer: Self-pay | Admitting: Family Medicine

## 2024-09-19 ENCOUNTER — Ambulatory Visit (INDEPENDENT_AMBULATORY_CARE_PROVIDER_SITE_OTHER): Admitting: Otolaryngology
